# Patient Record
Sex: Male | Born: 1995 | Race: Black or African American | Hispanic: No | Marital: Single | Smoking: Current every day smoker
Health system: Southern US, Community
[De-identification: ages and names within clinical notes are randomized; demographics above are authoritative.]

## PROBLEM LIST (undated history)

## (undated) DIAGNOSIS — J45909 Unspecified asthma, uncomplicated: Secondary | ICD-10-CM

---
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
## 2016-07-01 ENCOUNTER — Emergency Department (HOSPITAL_COMMUNITY): Payer: Medicaid Other

## 2016-07-01 ENCOUNTER — Encounter (HOSPITAL_COMMUNITY): Payer: Self-pay | Admitting: Emergency Medicine

## 2016-07-01 ENCOUNTER — Emergency Department (HOSPITAL_COMMUNITY)
Admission: EM | Admit: 2016-07-01 | Discharge: 2016-07-01 | Disposition: A | Discharge status: Home / Self Care | Payer: Medicaid Other | Attending: Emergency Medicine | Admitting: Emergency Medicine

## 2016-07-01 DIAGNOSIS — J45909 Unspecified asthma, uncomplicated: Secondary | ICD-10-CM | POA: Diagnosis not present

## 2016-07-01 DIAGNOSIS — Y929 Unspecified place or not applicable: Secondary | ICD-10-CM | POA: Diagnosis not present

## 2016-07-01 DIAGNOSIS — S6991XA Unspecified injury of right wrist, hand and finger(s), initial encounter: Secondary | ICD-10-CM | POA: Diagnosis present

## 2016-07-01 DIAGNOSIS — Y93E5 Activity, floor mopping and cleaning: Secondary | ICD-10-CM | POA: Insufficient documentation

## 2016-07-01 DIAGNOSIS — F17200 Nicotine dependence, unspecified, uncomplicated: Secondary | ICD-10-CM | POA: Insufficient documentation

## 2016-07-01 DIAGNOSIS — W458XXA Other foreign body or object entering through skin, initial encounter: Secondary | ICD-10-CM | POA: Insufficient documentation

## 2016-07-01 DIAGNOSIS — Y990 Civilian activity done for income or pay: Secondary | ICD-10-CM | POA: Diagnosis not present

## 2016-07-01 DIAGNOSIS — S60551A Superficial foreign body of right hand, initial encounter: Secondary | ICD-10-CM

## 2016-07-01 DIAGNOSIS — S60351A Superficial foreign body of right thumb, initial encounter: Secondary | ICD-10-CM | POA: Insufficient documentation

## 2016-07-01 HISTORY — DX: Unspecified asthma, uncomplicated: J45.909

## 2016-07-01 MED ORDER — HYDROCODONE-ACETAMINOPHEN 5-325 MG PO TABS
1.0000 | ORAL_TABLET | Freq: Once | ORAL | Status: AC
  Administered 2016-07-01: 1 via ORAL
  Filled 2016-07-01: qty 1

## 2016-07-01 MED ORDER — CEPHALEXIN 500 MG PO CAPS: 500.0000 mg | ORAL_CAPSULE | Freq: Four times a day (QID) | ORAL | Status: DC

## 2016-07-01 MED ORDER — CEPHALEXIN 250 MG PO CAPS
500.0000 mg | ORAL_CAPSULE | Freq: Once | ORAL | Status: AC
  Administered 2016-07-01: 500 mg via ORAL
  Filled 2016-07-01: qty 2

## 2016-07-01 MED ORDER — TETANUS-DIPHTH-ACELL PERTUSSIS 5-2.5-18.5 LF-MCG/0.5 IM SUSP
0.5000 mL | Freq: Once | INTRAMUSCULAR | Status: AC
  Administered 2016-07-01: 0.5 mL via INTRAMUSCULAR
  Filled 2016-07-01: qty 0.5

## 2016-07-01 NOTE — ED Notes (Signed)
Patient endorses that while mopping had piece of the fiberglass handle go through his right hand.  No active bleeding noted at this time. Patient does not remember when he last had a tetanus vaccination. Movement and pulses all WNL

## 2016-07-01 NOTE — ED Notes (Signed)
PA in room with this RN to remove fiberglass from patient thumb.  Patient tolerated well

## 2016-07-01 NOTE — ED Notes (Signed)
Pt's hand placed in basin with sterile saline and iodine

## 2016-07-01 NOTE — ED Provider Notes (Signed)
CSN: [id number]     Arrival date & time 07/01/16  1927 History  By signing my name below, I, Emmanuella Mensah, attest that this documentation has been prepared under the direction and in the presence of [employer], PA-C. Electronically Signed: Angelene Giovanni, ED Scribe. 07/01/2016. 9:03 PM.   CC: Foreign body right hand   The history is provided by the patient. No language interpreter was used.   HPI Comments: Dylan Tapia is a 20 y.o. male who presents to the Emergency Department complaining of a bleeding puncture wound to the base of his right thumb s/p hand injury that occurred at work PTA. He reports associated pain with ROM of the thumb. Pt explains that he was mopping with a mop with a broken handle when a broken piece of the fiberglass from the handle went through his right thumb. Pt presents with the fiber stuck through the base of his right thumb. No alleviating factors noted. Pt has not tried any medications PTA. He is unsure of his last tetanus vaccine. He reports NKDA. No fever, chills, or n/v.    Past Medical History  Diagnosis Date  . Asthma    History reviewed. No pertinent past surgical history. No family history on file. Social History  Substance Use Topics  . Smoking status: Current Every Day Smoker  . Smokeless tobacco: None  . Alcohol Use: No    Review of Systems  A complete 10 system review of systems was obtained and all systems are negative except as noted in the HPI and PMH.    Allergies  Review of patient's allergies indicates no known allergies.  Home Medications   Prior to Admission medications   Not on File   BP 143/80 mmHg  Pulse 117  Temp(Src) 98.4 F (36.9 C) (Oral)  Resp 18  SpO2 100% Physical Exam  Constitutional: He is oriented to person, place, and time. He appears well-developed and well-nourished.  HENT:  Head: Normocephalic and atraumatic.  Cardiovascular: Normal rate and regular rhythm.   Pulmonary/Chest: Effort normal.   Musculoskeletal:  1-2 mm shard of blue plastic exiting through the thenar eminence on the volar side, entering through the dorsal side. No active bleeding, range of motion is slightly decreased secondary to pain, distally neurovascularly intact.  Neurological: He is alert and oriented to person, place, and time.  Skin: Skin is warm and dry.  Psychiatric: He has a normal mood and affect.  Nursing note and vitals reviewed.   ED Course  .Foreign Body Removal Date/Time: 07/01/2016 10:23 PM Performed by: Wynetta Emery Authorized by: Wynetta Emery Consent: Verbal consent obtained. Patient identity confirmed: verbally with patient Body area: skin General location: upper extremity Location details: right hand Patient sedated: no Patient restrained: no Localization method: serial x-rays Dressing: antibiotic ointment Tendon involvement: none Depth: deep Complexity: complex 1 objects recovered. Objects recovered: blue plastic shard Post-procedure assessment: foreign body removed Patient tolerance: Patient tolerated the procedure well with no immediate complications   (including critical care time) DIAGNOSTIC STUDIES: Oxygen Saturation is 100% on RA, normal by my interpretation.    COORDINATION OF CARE: 9:01 PM- Pt advised of plan for treatment and pt agrees. Pt informed of his x-ray results. He received a foreign object removal. He will receive a Tdap and Vicodin. He will also receive a second x-ray for verification.   Imaging Review Dg Finger Thumb Right  07/01/2016  CLINICAL DATA:  Injury to the right thumb as a piece of a plastic cleaning device (squeegee) is  stuck at the base of the thumb. Initial encounter. EXAM: RIGHT THUMB 2+V COMPARISON:  Right hand x-rays 08/28/2009. FINDINGS: Linear faint opaque foreign body indicating a piece plastic traverses the soft tissues overlying the head of the 1st metacarpal. No evidence of acute fracture or dislocation. No intrinsic osseous  abnormalities. IMPRESSION: Linear faint opaque foreign body indicating a piece of plastic in the soft tissues overlying the head of the 1st metacarpal. No osseous abnormality. Electronically Signed   By: Hulan Saashomas  Lawrence M.D.   On: 07/01/2016 20:34     Joni ReiningNicole Yardley Beltran, PA-C has personally reviewed and evaluated these images as part of her medical decision-making.  MDM   Final diagnoses:  Foreign body in hand, right, initial encounter    Filed Vitals:   07/01/16 2002 07/01/16 2207  BP: 143/80 103/82  Pulse: 117 59  Temp: 98.4 F (36.9 C) 97.8 F (36.6 C)  TempSrc: Oral Oral  Resp: 18 16  SpO2: 100% 99%    Medications  Tdap (BOOSTRIX) injection 0.5 mL (0.5 mLs Intramuscular Given 07/01/16 2104)  HYDROcodone-acetaminophen (NORCO/VICODIN) 5-325 MG per tablet 1 tablet (1 tablet Oral Given 07/01/16 2104)  cephALEXin (KEFLEX) capsule 500 mg (500 mg Oral Given 07/01/16 2112)    Dylan Tapia is 20 y.o. male presenting with A plastic foreign body to dominant hand. Patient was using a squeegee handle of which was frayed and broken, it apparently broke worse while the patient was using it and chart is a plastic one into his hand. Tetanus is updated, Pt given the option of local anesthesia versus attempt of foreign body removal without anesthesia and he walks without. Foreign body is removed without issue, repeat x-ray with no residual retained fragments. It is still narrowed that it is not really feasible to irrigate the wound tract. Will start the patient on antibiotics and we've had an extensive discussion of wound care and return precautions for infection.  Evaluation does not show pathology that would require ongoing emergent intervention or inpatient treatment. Pt is hemodynamically stable and mentating appropriately. Discussed findings and plan with patient/guardian, who agrees with care plan. All questions answered. Return precautions discussed and outpatient follow up given.   New  Prescriptions   CEPHALEXIN (KEFLEX) 500 MG CAPSULE    Take 1 capsule (500 mg total) by mouth 4 (four) times daily.    I personally performed the services described in this documentation, which was scribed in my presence. The recorded information has been reviewed and is accurate.   Wynetta Emeryicole Jeorge Reister, PA-C 07/01/16 2226  Geoffery Lyonsouglas Delo, MD 07/01/16 2239

## 2016-07-01 NOTE — Discharge Instructions (Signed)
Wash the affected area with soap and water and apply a thin layer of topical antibiotic ointment. Do this every 12 hours.   Do not use rubbing alcohol or hydrogen peroxide.                        Look for signs of infection: if you see redness, if the area becomes warm, if pain increases sharply, there is discharge (pus), if red streaks appear or you develop fever or vomiting, RETURN immediately to the Emergency Department  for a recheck.    Sliver Removal, Care After A sliver--also called a splinter--is a small and thin broken piece of an object that gets stuck (embedded) under the skin. A sliver can create a deep wound that can easily become infected. It is important to care for the wound after a sliver is removed to help prevent infection and other problems from developing. WHAT TO EXPECT AFTER THE PROCEDURE Slivers often break into smaller pieces when they are removed. If pieces of your sliver broke off and stayed in your skin, you will eventually see them working themselves out and you may feel some pain at the wound site. This is normal. HOME CARE INSTRUCTIONS  Keep all follow-up visits as directed by your health care provider. This is important.  There are many different ways to close and cover a wound, including stitches (sutures) and adhesive strips. Follow your health care provider's instructions about:  Wound care.  Bandage (dressing) changes and removal.  Wound closure removal.  Check the wound site every day for signs of infection. Watch for:  Red streaks coming from the wound.  Fever.  Redness or tenderness around the wound.  Fluid, blood, or pus coming from the wound.  A bad smell coming from the wound. SEEK MEDICAL CARE IF:  You think that a piece of the sliver is still in your skin.  Your wound was closed, as with sutures, and the edges of the wound break open.  You have signs of infection, including:  New or worsening redness around the wound.  New or  worsening tenderness around the wound.  Fluid, blood, or pus coming from the wound.  A bad smell coming from the wound or dressing. SEEK IMMEDIATE MEDICAL CARE IF: You have any of the following signs of infection:  Red streaks coming from the wound.  An unexplained fever.   This information is not intended to replace advice given to you by your health care provider. Make sure you discuss any questions you have with your health care provider.   Document Released: 12/03/2000 Document Revised: 12/27/2014 Document Reviewed: 08/08/2014 Elsevier Interactive Patient Education [employer]2016 [employer] Inc.

## 2016-07-01 NOTE — ED Notes (Signed)
Sterile guaze applied to wound site on right hand and wrapped with gauze.

## 2016-07-01 NOTE — ED Notes (Signed)
Pt verbalized understanding of d/c instructions and has no further questions. Pt stable and NAD. Pt d/c home with girlfriend driving

## 2016-11-03 ENCOUNTER — Ambulatory Visit (HOSPITAL_COMMUNITY)
Admission: EM | Admit: 2016-11-03 | Discharge: 2016-11-03 | Disposition: A | Discharge status: Home / Self Care | Payer: Medicaid Other | Attending: Family Medicine | Admitting: Family Medicine

## 2016-11-03 ENCOUNTER — Encounter (HOSPITAL_COMMUNITY): Payer: Self-pay | Admitting: Emergency Medicine

## 2016-11-03 DIAGNOSIS — N410 Acute prostatitis: Secondary | ICD-10-CM | POA: Diagnosis not present

## 2016-11-03 LAB — POCT URINALYSIS DIP (DEVICE)
Bilirubin Urine: NEGATIVE
Glucose, UA: NEGATIVE mg/dL
Ketones, ur: NEGATIVE mg/dL
LEUKOCYTES UA: NEGATIVE
NITRITE: NEGATIVE
PROTEIN: NEGATIVE mg/dL
SPECIFIC GRAVITY, URINE: 1.025 (ref 1.005–1.030)
UROBILINOGEN UA: 0.2 mg/dL (ref 0.0–1.0)
pH: 7 (ref 5.0–8.0)

## 2016-11-03 MED ORDER — DOXYCYCLINE HYCLATE 100 MG PO CAPS: 100.0000 mg | ORAL_CAPSULE | Freq: Two times a day (BID) | ORAL | 0 refills | Status: DC

## 2016-11-03 NOTE — Discharge Instructions (Signed)
Use advil for soreness, take all of antibiotic, reduce caffeine, drink plenty of water

## 2016-11-03 NOTE — ED Provider Notes (Signed)
[facility]    CSN: [id number] Arrival date & time: 11/03/16  1104     History   Chief Complaint Chief Complaint  Patient presents with  . Dysuria    HPI Dylan Tapia is a 20 y.o. male.   The history is provided by the patient.  Dysuria  This is a new problem. The current episode started more than 2 days ago (pain with ejaculation, has been very sexually active recently and agrees that is likely related.). The problem has not changed since onset.Pertinent negatives include no chest pain and no abdominal pain.    Past Medical History:  Diagnosis Date  . Asthma     There are no active problems to display for this patient.   History reviewed. No pertinent surgical history.     Home Medications    Prior to Admission medications   Medication Sig Start Date End Date Taking? Authorizing Provider  cephALEXin (KEFLEX) 500 MG capsule Take 1 capsule (500 mg total) by mouth 4 (four) times daily. 07/01/16   Joni ReiningNicole Pisciotta, PA-C    Family History History reviewed. No pertinent family history.  Social History Social History  Substance Use Topics  . Smoking status: Current Every Day Smoker  . Smokeless tobacco: Never Used  . Alcohol use No     Allergies   Patient has no known allergies.   Review of Systems Review of Systems  Constitutional: Negative.   Cardiovascular: Negative for chest pain.  Gastrointestinal: Negative.  Negative for abdominal pain.  Genitourinary: Positive for dysuria. Negative for discharge, flank pain, frequency, genital sores, penile pain, penile swelling, scrotal swelling and testicular pain.  All other systems reviewed and are negative.    Physical Exam Triage Vital Signs ED Triage Vitals  Enc Vitals Group     BP 11/03/16 1127 119/69     Pulse Rate 11/03/16 1127 63     Resp 11/03/16 1127 18     Temp 11/03/16 1127 98.1 F (36.7 C)     Temp Source 11/03/16 1127 Oral     SpO2 11/03/16 1127 99 %     Weight --    Height --      Head Circumference --      Peak Flow --      Pain Score 11/03/16 1131 0     Pain Loc --      Pain Edu? --      Excl. in GC? --    No data found.   Updated Vital Signs BP 119/69 (BP Location: Left Arm)   Pulse 63   Temp 98.1 F (36.7 C) (Oral)   Resp 18   SpO2 99%   Visual Acuity Right Eye Distance:   Left Eye Distance:   Bilateral Distance:    Right Eye Near:   Left Eye Near:    Bilateral Near:     Physical Exam  Constitutional: He is oriented to person, place, and time. He appears well-developed and well-nourished. No distress.  Laughing during hx.  Abdominal: Soft. Bowel sounds are normal. He exhibits no mass. There is no tenderness. There is no rebound and no guarding.  Neurological: He is alert and oriented to person, place, and time.  Skin: Skin is warm and dry.  Nursing note and vitals reviewed.    UC Treatments / Results  Labs (all labs ordered are listed, but only abnormal results are displayed) Labs Reviewed  POCT URINALYSIS DIP (DEVICE) - Abnormal; Notable for the following:  Result Value   Hgb urine dipstick TRACE (*)    All other components within normal limits    EKG  EKG Interpretation None       Radiology No results found.  Procedures Procedures (including critical care time)  Medications Ordered in UC Medications - No data to display   Initial Impression / Assessment and Plan / UC Course  I have reviewed the triage vital signs and the nursing notes.  Pertinent labs & imaging results that were available during my care of the patient were reviewed by me and considered in my medical decision making (see chart for details).  Clinical Course       Final Clinical Impressions(s) / UC Diagnoses   Final diagnoses:  None    New Prescriptions New Prescriptions   No medications on file     Linna HoffJames D Adelfo Diebel, MD 11/03/16 1229

## 2016-11-03 NOTE — ED Triage Notes (Signed)
[facility] a [facility] dysuria when he urinates and pain when he ejaculates x 4 days.The patient denied any penile discharge.

## 2017-01-20 ENCOUNTER — Emergency Department (HOSPITAL_COMMUNITY)
Admission: EM | Admit: 2017-01-20 | Discharge: 2017-01-20 | Disposition: A | Discharge status: Home / Self Care | Payer: Medicaid Other | Attending: Emergency Medicine | Admitting: Emergency Medicine

## 2017-01-20 ENCOUNTER — Emergency Department (HOSPITAL_COMMUNITY): Payer: Medicaid Other

## 2017-01-20 ENCOUNTER — Encounter (HOSPITAL_COMMUNITY): Payer: Self-pay | Admitting: Emergency Medicine

## 2017-01-20 ENCOUNTER — Encounter (HOSPITAL_COMMUNITY): Payer: Self-pay

## 2017-01-20 ENCOUNTER — Emergency Department (HOSPITAL_COMMUNITY)
Admission: EM | Admit: 2017-01-20 | Discharge: 2017-01-20 | Disposition: A | Discharge status: Home / Self Care | Payer: Medicaid Other | Source: Home / Self Care | Attending: Emergency Medicine | Admitting: Emergency Medicine

## 2017-01-20 DIAGNOSIS — J45909 Unspecified asthma, uncomplicated: Secondary | ICD-10-CM | POA: Diagnosis not present

## 2017-01-20 DIAGNOSIS — Y999 Unspecified external cause status: Secondary | ICD-10-CM | POA: Insufficient documentation

## 2017-01-20 DIAGNOSIS — W228XXA Striking against or struck by other objects, initial encounter: Secondary | ICD-10-CM | POA: Insufficient documentation

## 2017-01-20 DIAGNOSIS — Z79899 Other long term (current) drug therapy: Secondary | ICD-10-CM | POA: Insufficient documentation

## 2017-01-20 DIAGNOSIS — S6991XA Unspecified injury of right wrist, hand and finger(s), initial encounter: Secondary | ICD-10-CM | POA: Diagnosis present

## 2017-01-20 DIAGNOSIS — Y929 Unspecified place or not applicable: Secondary | ICD-10-CM | POA: Insufficient documentation

## 2017-01-20 DIAGNOSIS — Z87891 Personal history of nicotine dependence: Secondary | ICD-10-CM | POA: Insufficient documentation

## 2017-01-20 DIAGNOSIS — S63255A Unspecified dislocation of left ring finger, initial encounter: Secondary | ICD-10-CM | POA: Insufficient documentation

## 2017-01-20 DIAGNOSIS — S63254A Unspecified dislocation of right ring finger, initial encounter: Secondary | ICD-10-CM | POA: Diagnosis not present

## 2017-01-20 DIAGNOSIS — S63269A Dislocation of metacarpophalangeal joint of unspecified finger, initial encounter: Secondary | ICD-10-CM

## 2017-01-20 DIAGNOSIS — S63266A Dislocation of metacarpophalangeal joint of right little finger, initial encounter: Secondary | ICD-10-CM

## 2017-01-20 DIAGNOSIS — IMO0001 Reserved for inherently not codable concepts without codable children: Secondary | ICD-10-CM

## 2017-01-20 DIAGNOSIS — Y939 Activity, unspecified: Secondary | ICD-10-CM | POA: Diagnosis not present

## 2017-01-20 DIAGNOSIS — S63264A Dislocation of metacarpophalangeal joint of right ring finger, initial encounter: Secondary | ICD-10-CM

## 2017-01-20 MED ORDER — OXYCODONE-ACETAMINOPHEN 5-325 MG PO TABS: 1.0000 | ORAL_TABLET | Freq: Four times a day (QID) | ORAL | 0 refills | Status: DC | PRN

## 2017-01-20 MED ORDER — PROPOFOL 10 MG/ML IV BOLUS
0.5000 mg/kg | Freq: Once | INTRAVENOUS | Status: AC
Start: 2017-01-20 — End: 2017-01-20
  Administered 2017-01-20: 42.3 mg via INTRAVENOUS
  Filled 2017-01-20: qty 20

## 2017-01-20 MED ORDER — OXYCODONE-ACETAMINOPHEN 5-325 MG PO TABS
1.0000 | ORAL_TABLET | Freq: Once | ORAL | Status: AC
  Administered 2017-01-20: 1 via ORAL
  Filled 2017-01-20: qty 1

## 2017-01-20 MED ORDER — PROPOFOL 10 MG/ML IV BOLUS
INTRAVENOUS | Status: AC | PRN
  Administered 2017-01-20: 20 mg via INTRAVENOUS

## 2017-01-20 MED ORDER — FENTANYL CITRATE (PF) 100 MCG/2ML IJ SOLN
INTRAMUSCULAR | Status: AC
  Filled 2017-01-20: qty 2

## 2017-01-20 MED ORDER — PROPOFOL 10 MG/ML IV BOLUS
INTRAVENOUS | Status: AC | PRN
  Administered 2017-01-20: 40 mg via INTRAVENOUS

## 2017-01-20 MED ORDER — HYDROMORPHONE HCL 2 MG/ML IJ SOLN
1.0000 mg | Freq: Once | INTRAMUSCULAR | Status: DC
Start: 2017-01-20 — End: 2017-01-21

## 2017-01-20 NOTE — ED Notes (Signed)
Patient exited room cursing and walked to waiting room.  Visitor left behind him.

## 2017-01-20 NOTE — Progress Notes (Signed)
Orthopedic Tech Progress Note Patient Details:  Dylan Tapia 05/04/1996 [id number]  Ortho Devices Type of Ortho Device: Ace wrap, Ulna gutter splint Ortho Device/Splint Location: RUE Ortho Device/Splint Interventions: Ordered, Application   Jennye MoccasinHughes, [name] 01/20/2017, 9:14 PM

## 2017-01-20 NOTE — Discharge Instructions (Signed)
Please follow up with hand specialist next week for further management of your hand injury.

## 2017-01-20 NOTE — ED Triage Notes (Signed)
Patient here with right hand pain and swelling after punching door today. Positive distal pulses and full ROM. Ice applied on arrival

## 2017-01-20 NOTE — ED Provider Notes (Signed)
[facility] Provider Note    By signing my name below, I, Earmon Phoenix, attest that this documentation has been prepared under the direction and in the presence of Fayrene Helper, PA-C. Electronically Signed: Earmon Phoenix, ED Scribe. 01/20/17. 10:15 PM    History   Chief Complaint Chief Complaint  Patient presents with  . Hand Injury    The history is provided by the patient and medical records. No language interpreter was used.    Dylan Tapia is a 21 y.o. male who presents to the Emergency Department complaining of an injury of the right hand secondary to punching a door approximately six hours ago. He reports associated severe sharp pain and swelling of the hand. He has not taken anything for pain relief. Touching the area or trying to move the fingers increases the pain. Pt denies alleviating factors. He denies numbness, tingling or weakness of the right hand, bruising, wounds or any other injuries. Pt is right hand dominant.    Past Medical History:  Diagnosis Date  . Asthma     There are no active problems to display for this patient.   History reviewed. No pertinent surgical history.     Home Medications    Prior to Admission medications   Medication Sig Start Date End Date Taking? Authorizing Provider  cephALEXin (KEFLEX) 500 MG capsule Take 1 capsule (500 mg total) by mouth 4 (four) times daily. 07/01/16   Nicole Pisciotta, PA-C  doxycycline (VIBRAMYCIN) 100 MG capsule Take 1 capsule (100 mg total) by mouth 2 (two) times daily. 11/03/16   Linna Hoff, MD    Family History No family history on file.  Social History Social History  Substance Use Topics  . Smoking status: Former Smoker    Types: Cigarettes    Quit date: 01/13/2017  . Smokeless tobacco: Never Used  . Alcohol use No     Allergies   Patient has no known allergies.   Review of Systems Review of Systems  Musculoskeletal: Positive for arthralgias and joint swelling.  Skin:  Negative for color change and wound.  Neurological: Positive for numbness. Negative for weakness.     Physical Exam Updated Vital Signs BP 131/78 (BP Location: Right Arm)   Pulse 114   Temp 98.6 F (37 C) (Oral)   Resp 14   Ht 5\' 10"  (1.778 m)   Wt 186 lb 3.2 oz (84.5 kg)   SpO2 100%   BMI 26.72 kg/m   Physical Exam  Constitutional: He is oriented to person, place, and time. He appears well-developed and well-nourished.  HENT:  Head: Normocephalic and atraumatic.  Neck: Normal range of motion.  Cardiovascular: Normal rate.   Brisk cap refill to all fingers of right hand. Right radial pulse 2+.  Pulmonary/Chest: Effort normal.  Musculoskeletal: He exhibits edema, tenderness and deformity.  Right hand with obvious closed deformity of proximal metacarpal region involving 3rd, 4th and 5th metacarpals. No skin tenting. Unable to make a fist secondary to pain. Able to flex and extend, supinate and pronate right wrist.  Neurological: He is alert and oriented to person, place, and time.  Skin: Skin is warm and dry.  Psychiatric: He has a normal mood and affect. His behavior is normal.  Nursing note and vitals reviewed.    ED Treatments / Results  DIAGNOSTIC STUDIES: Oxygen Saturation is 100% on RA, normal by my interpretation.   COORDINATION OF CARE: 8:20 PM- Will order pain medication and consult hand specialist. Pt verbalizes understanding and  agrees to plan.  8:33 PM- Spoke with Dr. Janee Morn who advised to attempt reduction here in the ED, apply ulnar gutter splint then get post reduction films.  9:01 PM- Discussed sedation vs narcotic pain relief for reduction procedure. Per pt request, will administer conscious sedation.   Medications  HYDROmorphone (DILAUDID) injection 1 mg (1 mg Intravenous Not Given 01/20/17 2145)  fentaNYL (SUBLIMAZE) 100 MCG/2ML injection (not administered)  oxyCODONE-acetaminophen (PERCOCET/ROXICET) 5-325 MG per tablet 1 tablet (1 tablet Oral Given  01/20/17 2028)  propofol (DIPRIVAN) 10 mg/mL bolus/IV push 42.3 mg (42.3 mg Intravenous Given 01/20/17 2145)  propofol (DIPRIVAN) 10 mg/mL bolus/IV push (40 mg Intravenous Given 01/20/17 2126)  propofol (DIPRIVAN) 10 mg/mL bolus/IV push (20 mg Intravenous Given 01/20/17 2127)  propofol (DIPRIVAN) 10 mg/mL bolus/IV push (20 mg Intravenous Given 01/20/17 2127)    Labs (all labs ordered are listed, but only abnormal results are displayed) Labs Reviewed - No data to display  EKG  EKG Interpretation None       Radiology Dg Hand 2 View Right  Result Date: 01/20/2017 CLINICAL DATA:  21 y/o  M; post reduction. EXAM: RIGHT HAND - 2 VIEW COMPARISON:  None. FINDINGS: On the lateral radiograph there appears to be reduction of fourth and fifth carpometacarpal dislocations. Fine bony details are obscured by overlying bandaging. No new acute fracture or dislocation identified. IMPRESSION: On the lateral radiograph there appears to be reduction of fourth and fifth carpometacarpal dislocations. Fine bony details are obscured by overlying bandaging. No new acute fracture or dislocation identified. Electronically Signed   By: Mitzi Hansen M.D.   On: 01/20/2017 22:03   Dg Hand Complete Right  Result Date: 01/20/2017 CLINICAL DATA:  21 y/o M; right hand pain after punching a door with swelling of the fourth and fifth metacarpals and metacarpophalangeal joints. EXAM: RIGHT HAND - COMPLETE 3+ VIEW COMPARISON:  07/01/2016 right hand radiograph. FINDINGS: Fourth and fifth digit carpometacarpal dislocation with posterior displacement of the metacarpals relative to the carpal bones. Bony densities at the site of dislocation probably represent fracture fragments. No other fracture is identified. IMPRESSION: Fourth and fifth digit carpometacarpal dislocation with posterior displacement of the metacarpals relative to the carpal bones. Bony densities at the site of dislocation probably represent fracture fragments. No  other fracture is identified. Electronically Signed   By: Mitzi Hansen M.D.   On: 01/20/2017 20:00    Procedures Reduction of dislocation Date/Time: 01/20/2017 9:18 PM Performed by: Fayrene Helper Authorized by: Fayrene Helper  Consent: Verbal consent obtained. Written consent obtained. Risks and benefits: risks, benefits and alternatives were discussed Consent given by: patient Patient understanding: patient states understanding of the procedure being performed Patient consent: the patient's understanding of the procedure matches consent given Procedure consent: procedure consent matches procedure scheduled Relevant documents: relevant documents present and verified Test results: test results available and properly labeled Site marked: the operative site was marked Imaging studies: imaging studies available Required items: required blood products, implants, devices, and special equipment available Patient identity confirmed: verbally with patient Time out: Immediately prior to procedure a "time out" was called to verify the correct patient, procedure, equipment, support staff and site/side marked as required. Preparation: Patient was prepped and draped in the usual sterile fashion. Local anesthesia used: no  Anesthesia: Local anesthesia used: no  Sedation: Patient sedated: yes Sedation type: moderate (conscious) sedation Sedatives: propofol Analgesia: fentanyl Sedation start date/time: 01/20/2017 9:25 PM Sedation end date/time: 01/20/2017 9:34 PM Vitals: Vital signs were monitored during sedation.  Patient tolerance: Patient tolerated the procedure well with no immediate complications  .Sedation Date/Time: 01/20/2017 9:06 PM Performed by: Fayrene HelperRAN, Verneda Hollopeter Authorized by: Fayrene HelperRAN, Brynden Thune   Consent:    Consent obtained:  Verbal and written   Consent given by:  Patient   Risks discussed:  Allergic reaction, prolonged hypoxia resulting in organ damage, prolonged sedation necessitating  reversal, inadequate sedation, respiratory compromise necessitating ventilatory assistance and intubation, vomiting, nausea and dysrhythmia   Alternatives discussed:  Analgesia without sedation Indications:    Procedure performed:  Dislocation reduction   Procedure necessitating sedation performed by:  Physician performing sedation   Intended level of sedation:  Moderate (conscious sedation) Pre-sedation assessment:    Time since last food or drink:  NPO all day   ASA classification: class 1 - normal, healthy patient     Neck mobility: normal     Mouth opening:  3 or more finger widths   Thyromental distance:  4 finger widths   Pre-sedation assessment completed:  01/20/2017 9:22 PM Immediate pre-procedure details:    Reassessment: Patient reassessed immediately prior to procedure   Procedure details (see MAR for exact dosages):    Sedation start time:  01/20/2017 9:25 PM   Preoxygenation:  Nasal cannula   Sedation:  Propofol   Analgesia:  None   Intra-procedure monitoring:  Cardiac monitor, blood pressure monitoring, frequent LOC assessments, frequent vital sign checks, continuous pulse oximetry and continuous capnometry   Intra-procedure events: none     Sedation end time:  01/20/2017 9:34 PM   Total sedation time (minutes):  9 Post-procedure details:    Post-sedation assessment completed:  01/20/2017 9:35 PM   Attendance: Constant attendance by certified staff until patient recovered     Recovery: Patient returned to pre-procedure baseline     Specimens recovered:  None   Patient is stable for discharge or admission: yes     Patient tolerance:  Tolerated well, no immediate complications   (including critical care time)  Medications Ordered in ED Medications  HYDROmorphone (DILAUDID) injection 1 mg (1 mg Intravenous Not Given 01/20/17 2145)  fentaNYL (SUBLIMAZE) 100 MCG/2ML injection (not administered)  oxyCODONE-acetaminophen (PERCOCET/ROXICET) 5-325 MG per tablet 1 tablet (1 tablet  Oral Given 01/20/17 2028)  propofol (DIPRIVAN) 10 mg/mL bolus/IV push 42.3 mg (42.3 mg Intravenous Given 01/20/17 2145)  propofol (DIPRIVAN) 10 mg/mL bolus/IV push (40 mg Intravenous Given 01/20/17 2126)  propofol (DIPRIVAN) 10 mg/mL bolus/IV push (20 mg Intravenous Given 01/20/17 2127)  propofol (DIPRIVAN) 10 mg/mL bolus/IV push (20 mg Intravenous Given 01/20/17 2127)     Initial Impression / Assessment and Plan / ED Course  I have reviewed the triage vital signs and the nursing notes.  Pertinent labs & imaging results that were available during my care of the patient were reviewed by me and considered in my medical decision making (see chart for details).     BP 126/74   Pulse 66   Temp 98.6 F (37 C) (Oral)   Resp 14   Ht 5\' 10"  (1.778 m)   Wt 84.5 kg   SpO2 100%   BMI 26.72 kg/m    I personally performed the services described in this documentation, which was scribed in my presence. The recorded information has been reviewed and is accurate.     Final Clinical Impressions(s) / ED Diagnoses   Final diagnoses:  Closed dislocation of fifth metacarpal bone of right hand  Closed dislocation of fourth metacarpal bone of right hand    New Prescriptions New  Prescriptions   OXYCODONE-ACETAMINOPHEN (PERCOCET/ROXICET) 5-325 MG TABLET    Take 1 tablet by mouth every 6 (six) hours as needed for severe pain.   10:37 PM Pt punched a wall and injured his R dominant hand.  He suffered a closed 4th and 5th metacarpal injury.  Successful reduction of dislocation under conscious sedation.  Pt will f/u with hand specialist Dr. Janee Morn for further management.     Fayrene Helper, PA-C 01/20/17 2240    Alvira Monday, MD 01/23/17 [phone]

## 2017-01-20 NOTE — ED Triage Notes (Signed)
Pt c/o hand pain that started around 2pm after he hit a door.  Hand is swollen, no other complaints at this time.

## 2017-09-25 ENCOUNTER — Emergency Department (HOSPITAL_COMMUNITY)
Admission: EM | Admit: 2017-09-25 | Discharge: 2017-09-25 | Disposition: A | Discharge status: Home / Self Care | Payer: Medicaid Other | Attending: Emergency Medicine | Admitting: Emergency Medicine

## 2017-09-25 ENCOUNTER — Encounter (HOSPITAL_COMMUNITY): Payer: Self-pay | Admitting: *Deleted

## 2017-09-25 ENCOUNTER — Emergency Department (HOSPITAL_COMMUNITY): Payer: Medicaid Other

## 2017-09-25 DIAGNOSIS — J45909 Unspecified asthma, uncomplicated: Secondary | ICD-10-CM | POA: Insufficient documentation

## 2017-09-25 DIAGNOSIS — Y998 Other external cause status: Secondary | ICD-10-CM | POA: Diagnosis not present

## 2017-09-25 DIAGNOSIS — Z87891 Personal history of nicotine dependence: Secondary | ICD-10-CM | POA: Insufficient documentation

## 2017-09-25 DIAGNOSIS — Y929 Unspecified place or not applicable: Secondary | ICD-10-CM | POA: Diagnosis not present

## 2017-09-25 DIAGNOSIS — Y9343 Activity, gymnastics: Secondary | ICD-10-CM | POA: Diagnosis not present

## 2017-09-25 DIAGNOSIS — S51812A Laceration without foreign body of left forearm, initial encounter: Secondary | ICD-10-CM | POA: Diagnosis present

## 2017-09-25 DIAGNOSIS — S41112A Laceration without foreign body of left upper arm, initial encounter: Secondary | ICD-10-CM

## 2017-09-25 DIAGNOSIS — W25XXXA Contact with sharp glass, initial encounter: Secondary | ICD-10-CM | POA: Insufficient documentation

## 2017-09-25 MED ORDER — TETANUS-DIPHTH-ACELL PERTUSSIS 5-2.5-18.5 LF-MCG/0.5 IM SUSP: 0.5000 mL | Freq: Once | INTRAMUSCULAR | Status: DC

## 2017-09-25 MED ORDER — CEPHALEXIN 500 MG PO CAPS
500.0000 mg | ORAL_CAPSULE | Freq: Four times a day (QID) | ORAL | 0 refills | Status: DC
Start: 2017-09-25 — End: 2018-05-19

## 2017-09-25 MED ORDER — LIDOCAINE-EPINEPHRINE (PF) 2 %-1:200000 IJ SOLN
10.0000 mL | Freq: Once | INTRAMUSCULAR | Status: AC
  Administered 2017-09-25: 10 mL
  Filled 2017-09-25: qty 20

## 2017-09-25 NOTE — ED Notes (Signed)
Patient refused wound irrigation until he is given lidocaine injection.

## 2017-09-25 NOTE — ED Notes (Signed)
Declined W/C at D/C and was escorted to lobby by RN. 

## 2017-09-25 NOTE — ED Triage Notes (Signed)
Pt reports doing a back flip and landed on glass door. Has multiple small lacerations and puncture wounds to left forearm. Bleeding controlled. Unsure about tetanus.

## 2017-09-25 NOTE — Discharge Instructions (Signed)
You may have diarrhea from the antibiotics.  It is very important that you continue to take the antibiotics even if you get diarrhea unless a medical professional tells you that you may stop taking them.  If you stop too early the bacteria you are being treated for will become stronger and you may need different, more powerful antibiotics that have more side effects and worsening diarrhea.  Please stay well hydrated and consider probiotics as they may decrease the severity of your diarrhea.   ° °Please take Ibuprofen (Advil, motrin) and Tylenol (acetaminophen) to relieve your pain.  You may take up to 600 MG (3 pills) of normal strength ibuprofen every 8 hours as needed.  In between doses of ibuprofen you make take tylenol, up to 1,000 mg (two extra strength pills).  Do not take more than 3,000 mg tylenol in a 24 hour period.  Please check all medication labels as many medications such as pain and cold medications may contain tylenol.  Do not drink alcohol while taking these medications.  Do not take other NSAID'S while taking ibuprofen (such as aleve or naproxen).  Please take ibuprofen with food to decrease stomach upset. ° ° °

## 2017-09-25 NOTE — ED Provider Notes (Signed)
[facility]-EMERGENCY DEPT Provider Note   CSN: [id number] Arrival date & time: 09/25/17  1558     History   Chief Complaint Chief Complaint  Patient presents with  . Laceration    HPI Dylan Tapia is a 21 y.o. male who presents for reported evaluation of lacerations to his left forearm that occurred shortly prior to arrival.  He reports that he was doing a back flip and landed on a glass door.  Chart review shows his last tetanus shot was in 2017.  He denies any numbness or tingling to his left hand.  He says he is able to move all his fingers.   HPI  Past Medical History:  Diagnosis Date  . Asthma     There are no active problems to display for this patient.   History reviewed. No pertinent surgical history.     Home Medications    Prior to Admission medications   Medication Sig Start Date End Date Taking? Authorizing Provider  ibuprofen (ADVIL,MOTRIN) 800 MG tablet Take 800 mg by mouth every 8 (eight) hours as needed for headache.   Yes [provider]  cephALEXin (KEFLEX) 500 MG capsule Take 1 capsule (500 mg total) by mouth 4 (four) times daily. 09/25/17   Cristina Gong, PA-C    Family History History reviewed. No pertinent family history.  Social History Social History  Substance Use Topics  . Smoking status: Former Smoker    Types: Cigarettes    Quit date: 01/13/2017  . Smokeless tobacco: Never Used  . Alcohol use No     Allergies   Patient has no known allergies.   Review of Systems Review of Systems  Constitutional: Negative for fatigue and fever.  Musculoskeletal: Negative for arthralgias.  Skin: Positive for wound. Negative for color change and pallor.  Neurological: Negative for weakness and numbness.     Physical Exam Updated Vital Signs BP 129/83 (BP Location: Right Arm)   Pulse (!) 110   Temp 98.1 F (36.7 C) (Oral)   Resp 18   SpO2 100%   Physical Exam  Constitutional: He appears well-developed and well-nourished.    HENT:  Head: Normocephalic and atraumatic.  Cardiovascular:  Left hand is warm and well perfused with good color.   Musculoskeletal:  Patient has full active ROM to all joints of the fingers on his left hand.    Neurological: He is alert.  Sensation intact to left upper extremity  Skin: He is not diaphoretic.  Left forearm: Multiple small lacerations (sub cm) to left forearm.  There are three larger lacerations.  The largest is approx 2cm over the volar aspect of his left forearm, which is through the dermis with subcutaneous tissue showing.  There is one V shaped laceration radial to the large laceration that is approx 1.5 cm.   There is a 1cm laceration over ventral forearm with subcutaneous tissue showing.   There are two 1cm lacerations more proximally that are very close and parallel to each other.  Subcutaneous tissue is present.  There is a oozing 1cm laceration present to the radial midforearm.  There are multiple other small lacerations that are sub centimeter, not through dermis, not bleeding and no repair needed.   Psychiatric: He has a normal mood and affect. His behavior is normal.  Nursing note and vitals reviewed.    ED Treatments / Results  Labs (all labs ordered are listed, but only abnormal results are displayed) Labs Reviewed - No data to display  EKG  EKG Interpretation None       Radiology Dg Forearm Left  Result Date: 09/25/2017 CLINICAL DATA:  Left forearm lacerations after landing on glass. EXAM: LEFT FOREARM - 2 VIEW COMPARISON:  None. FINDINGS: There is no evidence of fracture or other focal bone lesions. No radiopaque foreign body. Small laceration noted along the radial aspect of the volar distal forearm. IMPRESSION: Small laceration along the radial aspect of the volar distal forearm. No radiopaque foreign body. No acute osseous abnormality. Electronically Signed   By: Obie Dredge M.D.   On: 09/25/2017 16:28    Procedures .Marland KitchenLaceration  Repair Date/Time: 09/25/2017 8:56 PM Performed by: Cristina Gong Authorized by: Cristina Gong   Consent:    Consent obtained:  Verbal   Consent given by:  Patient   Risks discussed:  Infection, pain, need for additional repair, poor cosmetic result, retained foreign body, vascular damage, tendon damage and nerve damage (Retained glass)   Alternatives discussed:  No treatment (steri-strips, staples, skin glue) Anesthesia (see MAR for exact dosages):    Anesthesia method:  Local infiltration   Local anesthetic:  Lidocaine 2% WITH epi Laceration details:    Location:  Shoulder/arm   Shoulder/arm location:  L lower arm   Wound length (cm): 2. Repair type:    Repair type:  Simple Pre-procedure details:    Preparation:  Patient was prepped and draped in usual sterile fashion and imaging obtained to evaluate for foreign bodies Exploration:    Hemostasis achieved with:  Epinephrine and direct pressure   Wound exploration: wound explored through full range of motion and entire depth of wound probed and visualized     Wound extent: areolar tissue violated     Wound extent: no fascia violation noted, no muscle damage noted, no nerve damage noted, no tendon damage noted and no vascular damage noted   Treatment:    Area cleansed with:  Saline and soap and water (Chlorhexidine)   Irrigation solution:  Sterile saline   Irrigation method:  Syringe and tap Skin repair:    Repair method:  Sutures   Suture size:  4-0   Suture material:  Prolene   Suture technique: Simple interrupted, horizontal mattress.   Number of sutures:  3 Approximation:    Approximation:  Close Post-procedure details:    Dressing:  Antibiotic ointment, non-adherent dressing, sterile dressing and bulky dressing   Patient tolerance of procedure:  Tolerated well, no immediate complications    .Marland KitchenLaceration Repair #2 Date/Time: 09/25/2017 8:56 PM Performed by: Cristina Gong Authorized by: Cristina Gong   Consent:    Consent obtained:  Verbal   Consent given by:  Patient   Risks discussed:  Infection, pain, need for additional repair, poor cosmetic result, retained foreign body, vascular damage, tendon damage and nerve damage (Retained glass)   Alternatives discussed:  No treatment (steri-strips, staples, skin glue) Anesthesia (see MAR for exact dosages):    Anesthesia method:  Local infiltration   Local anesthetic:  Lidocaine 2% WITH epi Laceration details:    Location:  Shoulder/arm   Shoulder/arm location:  L lower arm   Wound length (cm): 1.V shaped Repair type:    Repair type:  Simple Pre-procedure details:    Preparation:  Patient was prepped and draped in usual sterile fashion and imaging obtained to evaluate for foreign bodies Exploration:    Hemostasis achieved with:  Epinephrine and direct pressure   Wound exploration: wound explored through full range of motion and entire depth  of wound probed and visualized     Wound extent: areolar tissue violated     Wound extent: no fascia violation noted, no muscle damage noted, no nerve damage noted, no tendon damage noted and no vascular damage noted   Treatment:    Area cleansed with:  Saline and soap and water (Chlorhexidine)   Irrigation solution:  Sterile saline   Irrigation method:  Syringe and tap Skin repair:    Repair method:  Sutures   Suture size:  5-0   Suture material:  Prolene   Suture technique:  horizontal mattress.   Number of sutures:  1 Approximation:    Approximation:  Close Post-procedure details:    Dressing:  Antibiotic ointment, non-adherent dressing, sterile dressing and bulky dressing   Patient tolerance of procedure:  Tolerated well, no immediate complications  .Marland KitchenLaceration Repair #3 (1cm) Date/Time: 09/25/2017 8:56 PM Performed by: Cristina Gong Authorized by: Cristina Gong   Consent:    Consent obtained:  Verbal   Consent given by:  Patient   Risks discussed:   Infection, pain, need for additional repair, poor cosmetic result, retained foreign body, vascular damage, tendon damage and nerve damage (Retained glass)   Alternatives discussed:  No treatment (steri-strips, staples, skin glue) Anesthesia (see MAR for exact dosages):    Anesthesia method:  Local infiltration   Local anesthetic:  Lidocaine 2% WITH epi Laceration details:    Location:  Shoulder/arm   Shoulder/arm location:  L lower arm   Wound length (cm): 1. Repair type:    Repair type:  Simple Pre-procedure details:    Preparation:  Patient was prepped and draped in usual sterile fashion and imaging obtained to evaluate for foreign bodies Exploration:    Hemostasis achieved with:  Epinephrine and direct pressure   Wound exploration: wound explored through full range of motion and entire depth of wound probed and visualized     Wound extent: areolar tissue violated     Wound extent: no fascia violation noted, no muscle damage noted, no nerve damage noted, no tendon damage noted and no vascular damage noted   Treatment:    Area cleansed with:  Saline and soap and water (Chlorhexidine)   Irrigation solution:  Sterile saline   Irrigation method:  Syringe and tap Skin repair:    Repair method:  Sutures   Suture size:  5-0   Suture material:  Prolene   Suture technique: horizontal mattress.   Number of sutures:  1 Approximation:    Approximation:  Close Post-procedure details:    Dressing:  Antibiotic ointment, non-adherent dressing, sterile dressing and bulky dressing   Patient tolerance of procedure:  Tolerated well, no immediate complications  .Marland KitchenLaceration Repair #4- two parallel 1cm lacerations Date/Time: 09/25/2017 8:56 PM Performed by: Cristina Gong Authorized by: Cristina Gong   Consent:    Consent obtained:  Verbal   Consent given by:  Patient   Risks discussed:  Infection, pain, need for additional repair, poor cosmetic result, retained foreign body,  vascular damage, tendon damage and nerve damage (Retained glass)   Alternatives discussed:  No treatment (steri-strips, staples, skin glue) Anesthesia (see MAR for exact dosages):    Anesthesia method:  Local infiltration   Local anesthetic:  Lidocaine 2% WITH epi Laceration details:    Location:  Shoulder/arm   Shoulder/arm location:  L lower arm   Wound length (cm): 1. Repair type:    Repair type:  Simple Pre-procedure details:    Preparation:  Patient  was prepped and draped in usual sterile fashion and imaging obtained to evaluate for foreign bodies Exploration:    Hemostasis achieved with:  Epinephrine and direct pressure   Wound exploration: wound explored through full range of motion and entire depth of wound probed and visualized     Wound extent: areolar tissue violated     Wound extent: no fascia violation noted, no muscle damage noted, no nerve damage noted, no tendon damage noted and no vascular damage noted   Treatment:    Area cleansed with:  Saline and soap and water (Chlorhexidine)   Irrigation solution:  Sterile saline   Irrigation method:  Syringe and tap Skin repair:    Repair method:  Sutures   Suture size:  5-0   Suture material:  Prolene   Suture technique: Simple interrupted   Number of sutures: 1 Approximation:    Approximation:  Close Post-procedure details:    Dressing:  Antibiotic ointment, non-adherent dressing, sterile dressing and bulky dressing   Patient tolerance of procedure:  Tolerated well, no immediate complications  .Marland KitchenLaceration Repair #5 Date/Time: 09/25/2017 8:56 PM Performed by: Cristina Gong Authorized by: Cristina Gong   Consent:    Consent obtained:  Verbal   Consent given by:  Patient   Risks discussed:  Infection, pain, need for additional repair, poor cosmetic result, retained foreign body, vascular damage, tendon damage and nerve damage (Retained glass)   Alternatives discussed:  No treatment (steri-strips, staples,  skin glue) Anesthesia (see MAR for exact dosages):    Anesthesia method:  Local infiltration   Local anesthetic:  Lidocaine 2% WITH epi Laceration details:    Location:  Shoulder/arm   Shoulder/arm location:  L lower arm   Wound length (cm): 1. Repair type:    Repair type:  Simple Pre-procedure details:    Preparation:  Patient was prepped and draped in usual sterile fashion and imaging obtained to evaluate for foreign bodies Exploration:    Hemostasis achieved with:  Epinephrine and direct pressure, sutures   Wound exploration: wound explored through full range of motion and entire depth of wound probed and visualized     Wound extent: areolar tissue violated     Wound extent: no fascia violation noted, no muscle damage noted, no nerve damage noted, no tendon damage noted and no vascular damage noted   Treatment:    Area cleansed with:  Saline and soap and water (Chlorhexidine)   Irrigation solution:  Sterile saline   Irrigation method:  Syringe and tap Skin repair:    Repair method:  Sutures   Suture size:  5-0   Suture material:  Prolene   Suture technique: Simple interrupted, horizontal mattress.   Number of sutures:  1 Approximation:    Approximation:  Close Post-procedure details:    Dressing:  Antibiotic ointment, non-adherent dressing, sterile dressing and bulky dressing   Patient tolerance of procedure:  Tolerated well, no immediate complications   Before and after the procedure the motor function,strength, sensation, and circulatory function was assessed.  After the procedure patient had intact motor function with 5/5 strength (equal to unaffected side) to all joints distal to the incision site with the only sensory changes attributable to the local anesthetic.     Medications Ordered in ED Medications  lidocaine-EPINEPHrine (XYLOCAINE W/EPI) 2 %-1:200000 (PF) injection 10 mL (10 mLs Infiltration Given 09/25/17 1717)     Initial Impression / Assessment and Plan /  ED Course  I have reviewed the triage vital signs  and the nursing notes.  Pertinent labs & imaging results that were available during my care of the patient were reviewed by me and considered in my medical decision making (see chart for details).    Pressure irrigation performed. Wound explored and base of wound visualized in a bloodless field without evidence of foreign body.  Laceration occurred < 8 hours prior to repair which was well tolerated.  Tdap up to date.  Pt has  no comorbidities to effect normal wound healing. Pt discharged  With antibiotics.  Discussed suture home care with patient and answered questions. Pt to follow-up for wound check and suture removal in 7-10 days; they are to return to the ED sooner for signs of infection. Pt is hemodynamically stable with no complaints prior to dc.     Final Clinical Impressions(s) / ED Diagnoses   Final diagnoses:  Laceration of left upper extremity, initial encounter    New Prescriptions Discharge Medication List as of 09/25/2017  6:22 PM    START taking these medications   Details  cephALEXin (KEFLEX) 500 MG capsule Take 1 capsule (500 mg total) by mouth 4 (four) times daily., Starting Sun 09/25/2017, Print         Cristina Gong, PA-C 09/25/17 2107    Nira Conn, MD 09/28/17 [phone]

## 2018-05-19 ENCOUNTER — Encounter (HOSPITAL_COMMUNITY): Payer: Self-pay

## 2018-05-19 ENCOUNTER — Other Ambulatory Visit: Payer: Self-pay

## 2018-05-19 ENCOUNTER — Emergency Department (HOSPITAL_COMMUNITY): Payer: Medicaid Other

## 2018-05-19 ENCOUNTER — Emergency Department (HOSPITAL_COMMUNITY)
Admission: EM | Admit: 2018-05-19 | Discharge: 2018-05-19 | Disposition: A | Discharge status: Home / Self Care | Payer: Medicaid Other | Attending: Emergency Medicine | Admitting: Emergency Medicine

## 2018-05-19 DIAGNOSIS — M7918 Myalgia, other site: Secondary | ICD-10-CM

## 2018-05-19 DIAGNOSIS — Z87891 Personal history of nicotine dependence: Secondary | ICD-10-CM | POA: Insufficient documentation

## 2018-05-19 DIAGNOSIS — R52 Pain, unspecified: Secondary | ICD-10-CM | POA: Insufficient documentation

## 2018-05-19 DIAGNOSIS — Z79899 Other long term (current) drug therapy: Secondary | ICD-10-CM | POA: Insufficient documentation

## 2018-05-19 MED ORDER — METHOCARBAMOL 500 MG PO TABS: 500.0000 mg | ORAL_TABLET | Freq: Three times a day (TID) | ORAL | 0 refills | Status: DC | PRN

## 2018-05-19 MED ORDER — NAPROXEN 250 MG PO TABS
500.0000 mg | ORAL_TABLET | Freq: Once | ORAL | Status: AC
  Administered 2018-05-19: 500 mg via ORAL
  Filled 2018-05-19: qty 2

## 2018-05-19 MED ORDER — NAPROXEN 500 MG PO TABS: 500.0000 mg | ORAL_TABLET | Freq: Two times a day (BID) | ORAL | 0 refills | Status: DC

## 2018-05-19 NOTE — ED Provider Notes (Signed)
[facility] EMERGENCY DEPARTMENT Provider Note   CSN: [id number] Arrival date & time: 05/19/18  1323     History   Chief Complaint Chief Complaint  Patient presents with  . Tailbone Pain    HPI Dylan Tapia is a 22 y.o. male who presents the emergency department with buttocks pain that started yesterday upon waking up.  Patient states that he started a new job about 1.5 months ago he does a lot of heavy lifting.  He states that since starting the job he had lower back pain.  Yesterday morning he woke up and he had some pain in the buttocks as well.  States his pain is constant, rates it a 9 out of 10 in severity, worse with movement, no alleviating factors.  He did take 100 mg of Advil yesterday without much change.  Other than heavy lifting no other change in activity or injuries.  Denies numbness, tingling, weakness, incontinence to bowel/bladder, fever, chills, IV drug use, or hx of cancer. Denies pain in the rectum area, diarrhea, constipation, or blood in stool.   HPI  Past Medical History:  Diagnosis Date  . Asthma     There are no active problems to display for this patient.   History reviewed. No pertinent surgical history.      Home Medications    Prior to Admission medications   Medication Sig Start Date End Date Taking? Authorizing Provider  cephALEXin (KEFLEX) 500 MG capsule Take 1 capsule (500 mg total) by mouth 4 (four) times daily. 09/25/17   Cristina Gong, PA-C  ibuprofen (ADVIL,MOTRIN) 800 MG tablet Take 800 mg by mouth every 8 (eight) hours as needed for headache.    [provider]    Family History History reviewed. No pertinent family history.  Social History Social History   Tobacco Use  . Smoking status: Former Smoker    Types: Cigarettes    Last attempt to quit: 01/13/2017    Years since quitting: 1.3  . Smokeless tobacco: Never Used  Substance Use Topics  . Alcohol use: No  . Drug use: Yes    Types:  Marijuana     Allergies   Patient has no known allergies.   Review of Systems Review of Systems  Constitutional: Negative for chills and fever.  Gastrointestinal: Negative for abdominal pain, anal bleeding, blood in stool, constipation, diarrhea, nausea, rectal pain and vomiting.  Musculoskeletal: Positive for back pain.       Positive for buttocks pain  Neurological: Negative for weakness and numbness.       Negative for incontinence or saddle anesthesia    Physical Exam Updated Vital Signs BP 139/83   Pulse 90   Temp 98.5 F (36.9 C)   Resp 18   SpO2 98%   Physical Exam  Constitutional: He appears well-developed and well-nourished. No distress.  HENT:  Head: Normocephalic and atraumatic.  Eyes: Conjunctivae are normal. Right eye exhibits no discharge. Left eye exhibits no discharge.  Abdominal: Soft. He exhibits no distension. There is no tenderness. There is no rebound and no guarding.  Musculoskeletal:  No obvious deformities, appreciable swelling, erythema, ecchymosis, or open wounds. Back: There is bilateral lumbar paraspinal muscle tenderness which extends to the gluteal region, more prominent gluteal region bilaterally.  Patient does have some diffuse midline tenderness to the upper sacral area, non focal. No midline lumbar tenderness.   Neurological: He is alert.  Clear speech.  Sensation grossly intact bilateral lower extremities.  5 out  of 5 symmetric strength plantar dorsiflexion bilaterally.  Patellar DTRs are 2+ and symmetric.  Gait is steady and intact.  Skin: Skin is warm and dry.  Psychiatric: He has a normal mood and affect. His behavior is normal. Thought content normal.  Nursing note and vitals reviewed.   ED Treatments / Results  Labs (all labs ordered are listed, but only abnormal results are displayed) Labs Reviewed - No data to display  EKG None  Radiology No results found.  Procedures Procedures (including critical care  time)  Medications Ordered in ED Medications  naproxen (NAPROSYN) tablet 500 mg (500 mg Oral Given 05/19/18 1535)    Initial Impression / Assessment and Plan / ED Course  I have reviewed the triage vital signs and the nursing notes.  Pertinent labs & imaging results that were available during my care of the patient were reviewed by me and considered in my medical decision making (see chart for details).   Patient presents with pain to lower back/buttocks region, more so in upper gluteal area. Patient is nontoxic appearing, in no apparent distress, vitals WNL. Diffusely tender to lower lumbar paraspinal region as well as the gluteal region, some midline tenderness to upper portion of sacrum which is nonfocal. X-ray obtained and negative for fracture/dislocation. No associated urinary or bowel sxs. No back pain red flags. Most likely muscle strain versus spasm. Considered disc disease, UTI/pyelonephritis, kidney stone, aortic aneurysm/dissection, cauda equina or epidural abscess however these do not fit clinical picture at this time. Will treat with Naproxen and Robaxin, discussed with patient that they are not to drive or operate heavy machinery while taking Robaxin. I discussed treatment plan, need for PCP follow-up, and return precautions with the patient. Provided opportunity for questions, patient confirmed understanding and is in agreement with plan.   Final Clinical Impressions(s) / ED Diagnoses   Final diagnoses:  Gluteal pain    ED Discharge Orders        Ordered    naproxen (NAPROSYN) 500 MG tablet  2 times daily     05/19/18 1523    methocarbamol (ROBAXIN) 500 MG tablet  Every 8 hours PRN     05/19/18 1523       Naziya Hegwood, ParadiseSamantha R, PA-C 05/19/18 1552    Arby BarrettePfeiffer, Marcy, MD 05/26/18 1531

## 2018-05-19 NOTE — ED Notes (Signed)
Pt verbalized understanding discharge instructions and denies any further needs or questions at this time. VS stable, ambulatory and steady gait.   

## 2018-05-19 NOTE — ED Triage Notes (Signed)
Patient complains of coccyx pain x 1 day, does a lot of lifting at work, denies trauma, no redness no swelling, NAD

## 2018-05-19 NOTE — Discharge Instructions (Addendum)
You are seen in the emergency department today for pain in your buttocks area.  The x-ray we did did not show fractures or dislocations.  We suspect this to be muscle related at this time.  We are prescribing you 2 medications to take:  Naproxen is a nonsteroidal anti-inflammatory medication that will help with pain and swelling. Be sure to take this medication as prescribed with food, 1 pill every 12 hours,  It should be taken with food, as it can cause stomach upset, and more seriously, stomach bleeding. Do not take other nonsteroidal anti-inflammatory medications with this such as Advil, Motrin, or Aleve.   Robaxin is the muscle relaxer I have prescribed, this is meant to help with muscle tightness. Be aware that this medication may make you drowsy therefore the first time you take this it should be at a time you are in an environment where you can rest. Do not drive or operate heavy machinery when taking this medication.    We have prescribed you new medication(s) today. Discuss the medications prescribed today with your pharmacist as they can have adverse effects and interactions with your other medicines including over the counter and prescribed medications. Seek medical evaluation if you start to experience new or abnormal symptoms after taking one of these medicines, seek care immediately if you start to experience difficulty breathing, feeling of your throat closing, facial swelling, or rash as these could be indications of a more serious allergic reaction  You may take Tylenol per over-the-counter dosing instructions safely with these medicines.  Apply heat to the area.  Follow-up with your primary care provider in 1 week if your symptoms have not improved.  If you do not have a primary care provider please call the phone number circled in your discharge instructions.  Return to the ER for new or worsening symptoms or any other concerns.

## 2018-10-10 IMAGING — DX DG HAND COMPLETE 3+V*R*
3 series · 3 of 3 positions shown · non-contrast
Comparison: 07/01/2016 right hand radiograph.

CLINICAL DATA: 20 y/o M; right hand pain after punching a door with
swelling of the fourth and fifth metacarpals and metacarpophalangeal
joints.

EXAM:
RIGHT HAND - COMPLETE 3+ VIEW

[hand ap]
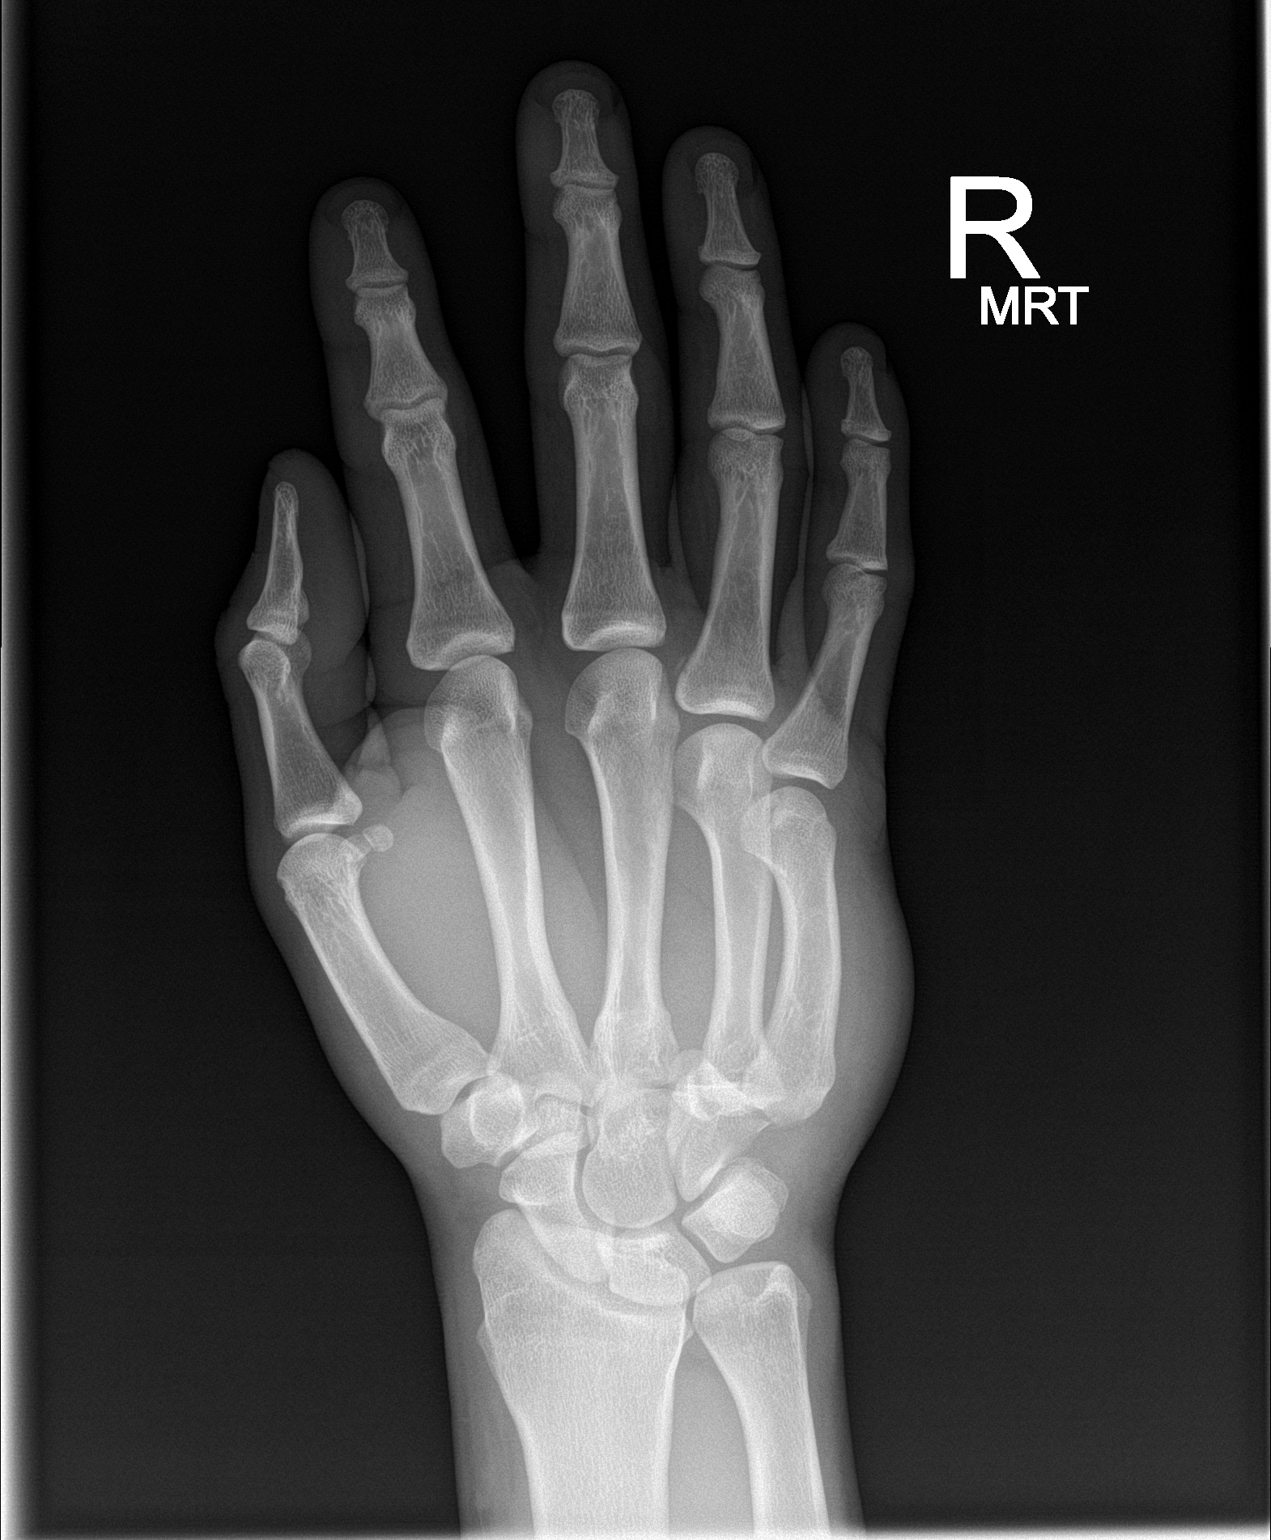

[hand obl]
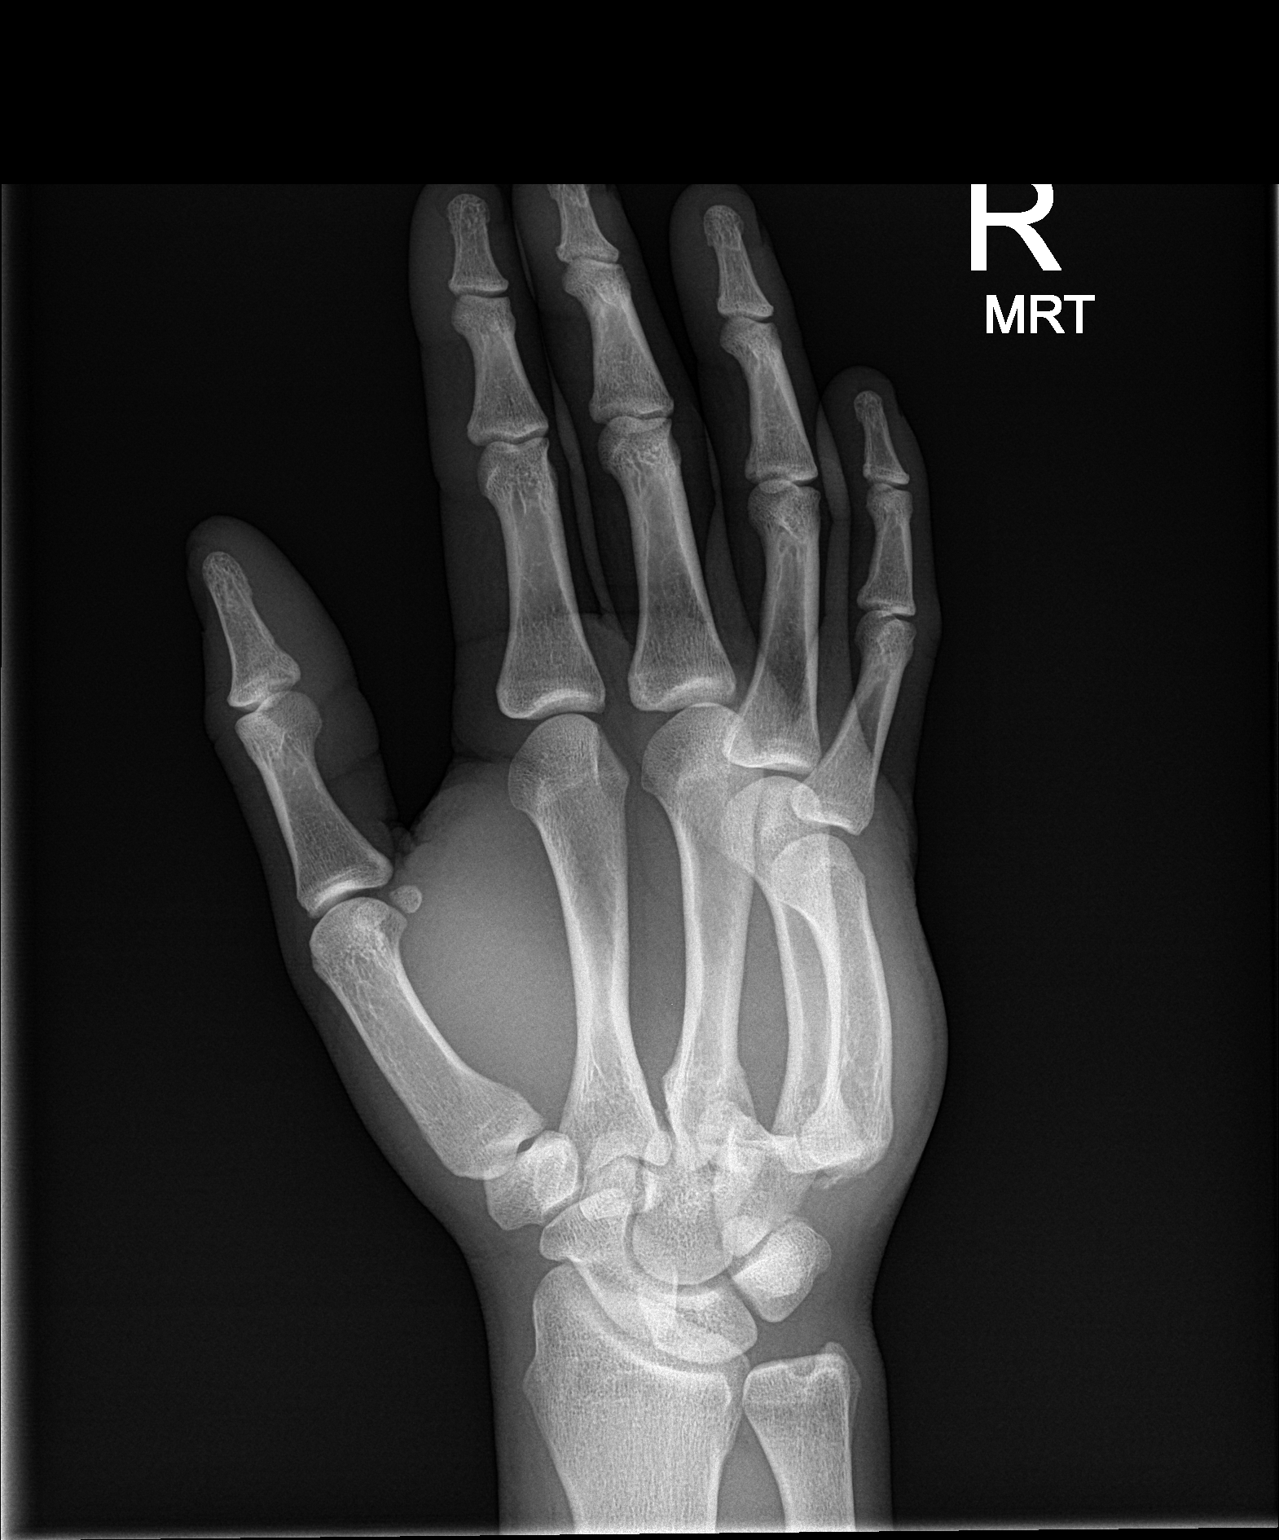

[hand lat]
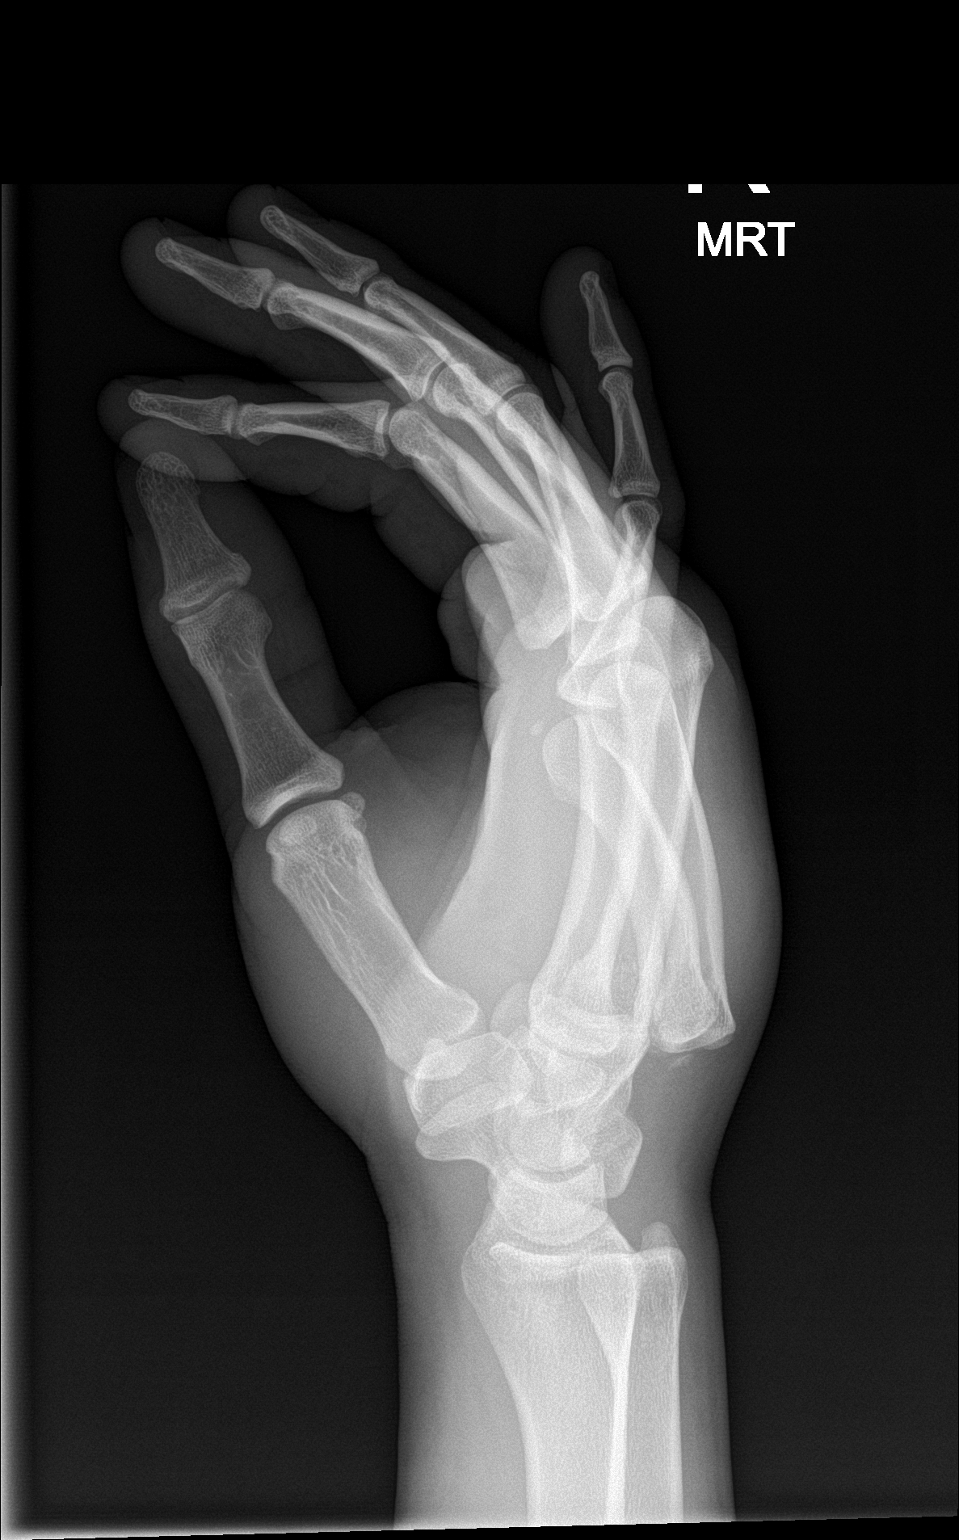

[3 of 3 positions shown; findings below may reference images not displayed]

FINDINGS: Fourth and fifth digit carpometacarpal dislocation with posterior
displacement of the metacarpals relative to the carpal bones. Bony
densities at the site of dislocation probably represent fracture
fragments. No other fracture is identified.
IMPRESSION: Fourth and fifth digit carpometacarpal dislocation with posterior
displacement of the metacarpals relative to the carpal bones. Bony
densities at the site of dislocation probably represent fracture
fragments. No other fracture is identified.

By: Rantona Bhebhe M.D.

## 2019-01-21 ENCOUNTER — Emergency Department (HOSPITAL_COMMUNITY)
Admission: EM | Admit: 2019-01-21 | Discharge: 2019-01-21 | Disposition: A | Discharge status: Home / Self Care | Payer: Medicaid Other | Attending: Emergency Medicine | Admitting: Emergency Medicine

## 2019-01-21 ENCOUNTER — Emergency Department (HOSPITAL_COMMUNITY): Payer: Medicaid Other

## 2019-01-21 ENCOUNTER — Encounter (HOSPITAL_COMMUNITY): Payer: Self-pay

## 2019-01-21 DIAGNOSIS — J45909 Unspecified asthma, uncomplicated: Secondary | ICD-10-CM | POA: Diagnosis not present

## 2019-01-21 DIAGNOSIS — M25512 Pain in left shoulder: Secondary | ICD-10-CM | POA: Insufficient documentation

## 2019-01-21 DIAGNOSIS — Z87891 Personal history of nicotine dependence: Secondary | ICD-10-CM | POA: Insufficient documentation

## 2019-01-21 MED ORDER — IBUPROFEN 800 MG PO TABS
800.0000 mg | ORAL_TABLET | Freq: Once | ORAL | Status: AC
  Administered 2019-01-21: 800 mg via ORAL
  Filled 2019-01-21: qty 1

## 2019-01-21 NOTE — ED Triage Notes (Signed)
Pt reports left shoulder pain since last night, denies injury or trauma. Pt also states he would like "a check up" When asked what type of check up he states "a sick checkup, std check" pt now reports back pain, dizziness but denies any GU symptoms. No swelling or redness noted to shoulder but pt unable to lift his arm. Pt a.o, nad. Requesting doctors note/

## 2019-01-21 NOTE — ED Provider Notes (Addendum)
[facility]   CSN: [id number] Arrival date & time: 01/21/19  1705     History   Chief Complaint Chief Complaint  Patient presents with  . Shoulder Pain    HPI Dylan Tapia is a 23 y.o. male.  HPI  23 year old male presents today complaining of left shoulder pain.  He states he began yesterday around 3:00.  He has no known injury.  Hurts with abduction, external rotation, or any other movement.  He denies any numbness or tingling.  Denies any previous similar symptoms.  He denies any chest pain, dyspnea, neck pain or neck injury, swelling, or redness.  Past Medical History:  Diagnosis Date  . Asthma     There are no active problems to display for this patient.   History reviewed. No pertinent surgical history.      Home Medications    Prior to Admission medications   Medication Sig Start Date End Date Taking? Authorizing Provider  methocarbamol (ROBAXIN) 500 MG tablet Take 1 tablet (500 mg total) by mouth every 8 (eight) hours as needed. 05/19/18   Petrucelli, Samantha R, PA-C  naproxen (NAPROSYN) 500 MG tablet Take 1 tablet (500 mg total) by mouth 2 (two) times daily. 05/19/18   Petrucelli, Pleas KochSamantha R, PA-C    Family History No family history on file.  Social History Social History   Tobacco Use  . Smoking status: Former Smoker    Types: Cigarettes    Last attempt to quit: 01/13/2017    Years since quitting: 2.0  . Smokeless tobacco: Never Used  Substance Use Topics  . Alcohol use: No  . Drug use: Yes    Types: Marijuana     Allergies   Patient has no known allergies.   Review of Systems Review of Systems  All other systems reviewed and are negative.    Physical Exam Updated Vital Signs BP (!) 170/92 (BP Location: Right Arm)   Pulse 82   Temp 98.3 F (36.8 C) (Oral)   Resp 18   SpO2 95%   Physical Exam Vitals signs and nursing note reviewed.  Constitutional:      Appearance: Normal  appearance. He is well-developed.  HENT:     Head: Normocephalic and atraumatic.     Right Ear: External ear normal.     Left Ear: External ear normal.     Nose: Nose normal.  Neck:     Trachea: No tracheal deviation.     Comments: Left shoulder with no visualized deformity Some tenderness left anterior shoulder Decreased external rotation, and abduction secondary to pain Pulses intact Sensation intact throughout upper extremity No tenderness, swelling, or decreased  range of motion of left elbow or wrist Cardiovascular:     Rate and Rhythm: Normal rate.     Pulses: Normal pulses.  Pulmonary:     Effort: Pulmonary effort is normal.  Musculoskeletal: Normal range of motion.  Skin:    General: Skin is warm and dry.     Capillary Refill: Capillary refill takes less than 2 seconds.  Neurological:     Mental Status: He is alert and oriented to person, place, and time.  Psychiatric:        Behavior: Behavior normal.      ED Treatments / Results  Labs (all labs ordered are listed, but only abnormal results are displayed) Labs Reviewed - No data to display  EKG None  Radiology No results found.  Procedures Procedures (including critical care  time)  Medications Ordered in ED Medications  ibuprofen (ADVIL,MOTRIN) tablet 800 mg (has no administration in time range)     Initial Impression / Assessment and Plan / ED Course  I have reviewed the triage vital signs and the nursing notes.  Pertinent labs & imaging results that were available during my care of the patient were reviewed by me and considered in my medical decision making (see chart for details).    23 year old male with atraumatic pain left shoulder with some decreased range of motion.  I see no signs of infection with no warmth or swelling noted.  X-rays obtained showed no evidence of fracture.  Plan sling and follow-up with orthopedic surgery.  Final Clinical Impressions(s) / ED Diagnoses   Final diagnoses:   Acute pain of left shoulder    ED Discharge Orders    None       Margarita Grizzle, MD 01/26/19 1534    Margarita Grizzle, MD 01/26/19 1534

## 2019-06-04 ENCOUNTER — Other Ambulatory Visit: Payer: Self-pay

## 2019-06-04 ENCOUNTER — Encounter (HOSPITAL_COMMUNITY): Payer: Self-pay

## 2019-06-04 ENCOUNTER — Ambulatory Visit (HOSPITAL_COMMUNITY)
Admission: EM | Admit: 2019-06-04 | Discharge: 2019-06-04 | Disposition: A | Discharge status: Home / Self Care | Payer: Medicaid Other | Attending: Physician Assistant | Admitting: Physician Assistant

## 2019-06-04 DIAGNOSIS — Z202 Contact with and (suspected) exposure to infections with a predominantly sexual mode of transmission: Secondary | ICD-10-CM | POA: Diagnosis not present

## 2019-06-04 MED ORDER — AZITHROMYCIN 250 MG PO TABS
ORAL_TABLET | ORAL | Status: AC
  Filled 2019-06-04: qty 4

## 2019-06-04 MED ORDER — CEFTRIAXONE SODIUM 250 MG IJ SOLR
INTRAMUSCULAR | Status: AC
  Filled 2019-06-04: qty 250

## 2019-06-04 MED ORDER — CEFTRIAXONE SODIUM 250 MG IJ SOLR
250.0000 mg | Freq: Once | INTRAMUSCULAR | Status: AC
  Administered 2019-06-04: 250 mg via INTRAMUSCULAR

## 2019-06-04 MED ORDER — AZITHROMYCIN 250 MG PO TABS
1000.0000 mg | ORAL_TABLET | Freq: Once | ORAL | Status: AC
  Administered 2019-06-04: 1000 mg via ORAL

## 2019-06-04 NOTE — Discharge Instructions (Signed)
Return if any problems.

## 2019-06-04 NOTE — ED Triage Notes (Signed)
Pt states it hurts when he voids. It's been off and on for 4 days this pain while voiding.

## 2019-06-04 NOTE — ED Provider Notes (Signed)
[place]    CSN: [id number] Arrival date & time: 06/04/19  0857      History   Chief Complaint Chief Complaint  Patient presents with  . SEXUALLY TRANSMITTED DISEASE    HPI Dylan Tapia is a 23 y.o. male.   The history is provided by the patient. No language interpreter was used.  Dysuria Presenting symptoms: dysuria   Relieved by:  Nothing Worsened by:  Nothing Ineffective treatments:  None tried Associated symptoms: no abdominal pain   Risk factors: no HIV     Past Medical History:  Diagnosis Date  . Asthma     There are no active problems to display for this patient.   History reviewed. No pertinent surgical history.     Home Medications    Prior to Admission medications   Medication Sig Start Date End Date Taking? Authorizing Provider  methocarbamol (ROBAXIN) 500 MG tablet Take 1 tablet (500 mg total) by mouth every 8 (eight) hours as needed. 05/19/18   Petrucelli, Samantha R, PA-C  naproxen (NAPROSYN) 500 MG tablet Take 1 tablet (500 mg total) by mouth 2 (two) times daily. 05/19/18   Petrucelli, Glynda Jaeger, PA-C    Family History History reviewed. No pertinent family history.  Social History Social History   Tobacco Use  . Smoking status: Former Smoker    Types: Cigarettes    Quit date: 01/13/2017    Years since quitting: 2.3  . Smokeless tobacco: Never Used  Substance Use Topics  . Alcohol use: No  . Drug use: Yes    Types: Marijuana     Allergies   Patient has no known allergies.   Review of Systems Review of Systems  Gastrointestinal: Negative for abdominal pain.  Genitourinary: Positive for dysuria.  All other systems reviewed and are negative.    Physical Exam Triage Vital Signs ED Triage Vitals  Enc Vitals Group     BP 06/04/19 0917 114/73     Pulse Rate 06/04/19 0917 69     Resp 06/04/19 0917 18     Temp 06/04/19 0917 97.8 F (36.6 C)     Temp src --      SpO2 06/04/19 0917 98 %     Weight 06/04/19 0915  170 lb (77.1 kg)     Height --      Head Circumference --      Peak Flow --      Pain Score 06/04/19 0915 2     Pain Loc --      Pain Edu? --      Excl. in [place]? --    No data found.  Updated Vital Signs BP 114/73 (BP Location: Right Arm)   Pulse 69   Temp 97.8 F (36.6 C)   Resp 18   Wt 77.1 kg   SpO2 98%   BMI 24.39 kg/m   Visual Acuity Right Eye Distance:   Left Eye Distance:   Bilateral Distance:    Right Eye Near:   Left Eye Near:    Bilateral Near:     Physical Exam Vitals signs and nursing note reviewed.  Constitutional:      Appearance: He is well-developed.  HENT:     Head: Normocephalic.     Right Ear: Tympanic membrane normal.  Neck:     Musculoskeletal: Normal range of motion.  Cardiovascular:     Rate and Rhythm: Normal rate.     Pulses: Normal pulses.  Pulmonary:     Effort:  Pulmonary effort is normal.  Abdominal:     General: Abdomen is flat. There is no distension.  Musculoskeletal: Normal range of motion.  Skin:    General: Skin is warm.  Neurological:     Mental Status: He is alert and oriented to person, place, and time.  Psychiatric:        Mood and Affect: Mood normal.      UC Treatments / Results  Labs (all labs ordered are listed, but only abnormal results are displayed) Labs Reviewed  RPR  HIV ANTIBODY (ROUTINE TESTING W REFLEX)  URINE CYTOLOGY ANCILLARY ONLY    EKG None  Radiology No results found.  Procedures Procedures (including critical care time)  Medications Ordered in UC Medications  cefTRIAXone (ROCEPHIN) injection 250 mg (has no administration in time range)  azithromycin (ZITHROMAX) tablet 1,000 mg (has no administration in time range)    Initial Impression / Assessment and Plan / UC Course  I have reviewed the triage vital signs and the nursing notes.  Pertinent labs & imaging results that were available during my care of the patient were reviewed by me and considered in my medical decision making  (see chart for details).    MDM  Pt given Rocephin and zithromax   Final Clinical Impressions(s) / UC Diagnoses   Final diagnoses:  Exposure to STD     Discharge Instructions     Return if any problems.    ED Prescriptions    None     Controlled Substance Prescriptions [place] Controlled Substance Registry consulted? Not Applicable   Elson AreasSofia,  K, [place] JerseyPA-C 06/04/19 1024

## 2019-06-05 LAB — HIV ANTIBODY (ROUTINE TESTING W REFLEX): HIV Screen 4th Generation wRfx: NONREACTIVE

## 2019-06-06 LAB — RPR: RPR Ser Ql: NONREACTIVE

## 2019-06-06 LAB — URINE CYTOLOGY ANCILLARY ONLY
Chlamydia: NEGATIVE
Neisseria Gonorrhea: NEGATIVE

## 2020-02-08 ENCOUNTER — Encounter (HOSPITAL_COMMUNITY): Payer: Self-pay

## 2020-02-08 ENCOUNTER — Other Ambulatory Visit: Payer: Self-pay

## 2020-02-08 ENCOUNTER — Ambulatory Visit (HOSPITAL_COMMUNITY)
Admission: EM | Admit: 2020-02-08 | Discharge: 2020-02-08 | Disposition: A | Discharge status: Home / Self Care | Payer: Medicaid Other | Attending: Family Medicine | Admitting: Family Medicine

## 2020-02-08 DIAGNOSIS — Z202 Contact with and (suspected) exposure to infections with a predominantly sexual mode of transmission: Secondary | ICD-10-CM | POA: Diagnosis present

## 2020-02-08 NOTE — ED Triage Notes (Signed)
Pt presents to UC for STD's testing. Pt denies any signs and symptoms.

## 2020-02-08 NOTE — Discharge Instructions (Signed)
Check My Chart for test results Safe sex recommended

## 2020-02-08 NOTE — ED Provider Notes (Signed)
[facility]    CSN: [id number] Arrival date & time: 02/08/20  1634      History   Chief Complaint Chief Complaint  Patient presents with  . STD test    HPI Dylan Tapia is a 24 y.o. male.   HPI   Patient is here for STD check He admits to unprotected sexual relations He denies any symptoms, dysuria, discharge, rash He denies any knowledge of exposure to infection His reason for coming in is that he likes to check periodically   Past Medical History:  Diagnosis Date  . Asthma     There are no problems to display for this patient.   History reviewed. No pertinent surgical history.     Home Medications    Prior to Admission medications   Not on File    Family History History reviewed. No pertinent family history.  Social History Social History   Tobacco Use  . Smoking status: Former Smoker    Types: Cigarettes    Quit date: 01/13/2017    Years since quitting: 3.0  . Smokeless tobacco: Never Used  Substance Use Topics  . Alcohol use: No  . Drug use: Yes    Types: Marijuana     Allergies   Patient has no known allergies.   Review of Systems Review of Systems  Genitourinary: Negative.        Denies any urinary symptoms  Skin: Negative for rash.     Physical Exam Triage Vital Signs ED Triage Vitals  Enc Vitals Group     BP 02/08/20 1643 124/76     Pulse Rate 02/08/20 1643 74     Resp 02/08/20 1643 16     Temp 02/08/20 1643 98.6 F (37 C)     Temp Source 02/08/20 1643 Oral     SpO2 02/08/20 1643 97 %     Weight --      Height --      Head Circumference --      Peak Flow --      Pain Score 02/08/20 1642 0     Pain Loc --      Pain Edu? --      Excl. in [place]? --    No data found.  Updated Vital Signs BP 124/76 (BP Location: Right Arm)   Pulse 74   Temp 98.6 F (37 C) (Oral)   Resp 16   SpO2 97%       Physical Exam Constitutional:      General: He is not in acute distress.    Appearance: Normal appearance.  He is well-developed and normal weight.  HENT:     Head: Normocephalic and atraumatic.     Mouth/Throat:     Comments: Mask in place Eyes:     Conjunctiva/sclera: Conjunctivae normal.     Pupils: Pupils are equal, round, and reactive to light.  Cardiovascular:     Rate and Rhythm: Normal rate.  Pulmonary:     Effort: Pulmonary effort is normal. No respiratory distress.  Genitourinary:    Penis: Normal.      Comments: Normal circumcised penis.  No discharge.  No lesions or rash Musculoskeletal:        General: Normal range of motion.     Cervical back: Normal range of motion.  Skin:    General: Skin is warm and dry.  Neurological:     Mental Status: He is alert.  Psychiatric:        Mood and  Affect: Mood normal.        Behavior: Behavior normal.      UC Treatments / Results  Labs (all labs ordered are listed, but only abnormal results are displayed) Labs Reviewed  CYTOLOGY, (ORAL, ANAL, URETHRAL) ANCILLARY ONLY    EKG   Radiology No results found.  Procedures Procedures (including critical care time)  Medications Ordered in UC Medications - No data to display  Initial Impression / Assessment and Plan / UC Course  I have reviewed the triage vital signs and the nursing notes.  Pertinent labs & imaging results that were available during my care of the patient were reviewed by me and considered in my medical decision making (see chart for details).      Final Clinical Impressions(s) / UC Diagnoses   Final diagnoses:  Possible exposure to STD     Discharge Instructions     Check My Chart for test results Safe sex recommended   ED Prescriptions    None     PDMP not reviewed this encounter.   Raylene Everts, MD 02/08/20 [phone]

## 2020-02-12 LAB — CYTOLOGY, (ORAL, ANAL, URETHRAL) ANCILLARY ONLY
Chlamydia: NEGATIVE
Neisseria Gonorrhea: NEGATIVE
Trichomonas: NEGATIVE

## 2020-05-02 ENCOUNTER — Encounter (HOSPITAL_COMMUNITY): Payer: Self-pay

## 2020-05-02 ENCOUNTER — Other Ambulatory Visit: Payer: Self-pay

## 2020-05-02 ENCOUNTER — Ambulatory Visit (HOSPITAL_COMMUNITY)
Admission: EM | Admit: 2020-05-02 | Discharge: 2020-05-02 | Disposition: A | Discharge status: Home / Self Care | Payer: Medicaid Other | Attending: Family Medicine | Admitting: Family Medicine

## 2020-05-02 DIAGNOSIS — N342 Other urethritis: Secondary | ICD-10-CM

## 2020-05-02 DIAGNOSIS — R300 Dysuria: Secondary | ICD-10-CM | POA: Diagnosis present

## 2020-05-02 LAB — POCT URINALYSIS DIP (DEVICE)
Bilirubin Urine: NEGATIVE
Glucose, UA: NEGATIVE mg/dL
Ketones, ur: NEGATIVE mg/dL
Nitrite: NEGATIVE
Protein, ur: NEGATIVE mg/dL
Specific Gravity, Urine: 1.025 (ref 1.005–1.030)
Urobilinogen, UA: 0.2 mg/dL (ref 0.0–1.0)
pH: 6.5 (ref 5.0–8.0)

## 2020-05-02 MED ORDER — LIDOCAINE HCL (PF) 1 % IJ SOLN
INTRAMUSCULAR | Status: AC
  Filled 2020-05-02: qty 2

## 2020-05-02 MED ORDER — CEFTRIAXONE SODIUM 500 MG IJ SOLR
500.0000 mg | Freq: Once | INTRAMUSCULAR | Status: AC
  Administered 2020-05-02: 500 mg via INTRAMUSCULAR

## 2020-05-02 MED ORDER — DOXYCYCLINE HYCLATE 100 MG PO CAPS: 100.0000 mg | ORAL_CAPSULE | Freq: Two times a day (BID) | ORAL | 0 refills | Status: DC

## 2020-05-02 MED ORDER — CEFTRIAXONE SODIUM 500 MG IJ SOLR
INTRAMUSCULAR | Status: AC
  Filled 2020-05-02: qty 500

## 2020-05-02 NOTE — ED Provider Notes (Signed)
[place]    CSN: [id number] Arrival date & time: 05/02/20  1002      History   Chief Complaint Chief Complaint  Patient presents with  . Dysuria  . STD test    HPI Dylan Tapia is a 24 y.o. male.   HPI  Patient has yellow penile discharge and dysuria since yesterday No fever or chills Is no abdominal pain No rash He does have unprotected sexual relations We discussed the value of safe sex and condoms to prevent infection of pregnancy  Past Medical History:  Diagnosis Date  . Asthma     There are no problems to display for this patient.   History reviewed. No pertinent surgical history.     Home Medications    Prior to Admission medications   Medication Sig Start Date End Date Taking? Authorizing Provider  doxycycline (VIBRAMYCIN) 100 MG capsule Take 1 capsule (100 mg total) by mouth 2 (two) times daily. 05/02/20   Raylene Everts, MD    Family History History reviewed. No pertinent family history.  Social History Social History   Tobacco Use  . Smoking status: Former Smoker    Types: Cigarettes    Quit date: 01/13/2017    Years since quitting: 3.3  . Smokeless tobacco: Never Used  Substance Use Topics  . Alcohol use: No  . Drug use: Yes    Types: Marijuana     Allergies   Patient has no known allergies.   Review of Systems Review of Systems  Genitourinary: Positive for discharge and dysuria.     Physical Exam Triage Vital Signs ED Triage Vitals  Enc Vitals Group     BP 05/02/20 1028 115/74     Pulse Rate 05/02/20 1028 72     Resp 05/02/20 1028 17     Temp 05/02/20 1028 98 F (36.7 C)     Temp Source 05/02/20 1028 Oral     SpO2 05/02/20 1028 98 %     Weight --      Height --      Head Circumference --      Peak Flow --      Pain Score 05/02/20 1026 0     Pain Loc --      Pain Edu? --      Excl. in [place]? --    No data found.  Updated Vital Signs BP 115/74 (BP Location: Left Arm)   Pulse 72   Temp 98 F  (36.7 C) (Oral)   Resp 17   SpO2 98%      Physical Exam Constitutional:      General: He is not in acute distress.    Appearance: He is well-developed.  HENT:     Head: Normocephalic and atraumatic.  Eyes:     Conjunctiva/sclera: Conjunctivae normal.     Pupils: Pupils are equal, round, and reactive to light.  Cardiovascular:     Rate and Rhythm: Normal rate.  Pulmonary:     Effort: Pulmonary effort is normal. No respiratory distress.  Abdominal:     General: There is no distension.     Palpations: Abdomen is soft.  Genitourinary:    Penis: Normal.      Comments: Normal circumcised penis. Yellow discharge present Musculoskeletal:        General: Normal range of motion.     Cervical back: Normal range of motion.  Skin:    General: Skin is warm and dry.  Neurological:  Mental Status: He is alert.  Psychiatric:        Mood and Affect: Mood normal.        Behavior: Behavior normal.      UC Treatments / Results  Labs (all labs ordered are listed, but only abnormal results are displayed) Labs Reviewed  POCT URINALYSIS DIP (DEVICE) - Abnormal; Notable for the following components:      Result Value   Hgb urine dipstick TRACE (*)    Leukocytes,Ua MODERATE (*)    All other components within normal limits  CYTOLOGY, (ORAL, ANAL, URETHRAL) ANCILLARY ONLY    EKG   Radiology No results found.  Procedures Procedures (including critical care time)  Medications Ordered in UC Medications  cefTRIAXone (ROCEPHIN) injection 500 mg (has no administration in time range)    Initial Impression / Assessment and Plan / UC Course  I have reviewed the triage vital signs and the nursing notes.  Pertinent labs & imaging results that were available during my care of the patient were reviewed by me and considered in my medical decision making (see chart for details).      Final Clinical Impressions(s) / UC Diagnoses   Final diagnoses:  Dysuria  Urethritis      Discharge Instructions     Avoid sexual encounters until you have completed the 7 days of antibiotics Your test results will be available on My Chart You will be called if any tests are positive    ED Prescriptions    Medication Sig Dispense Auth. Provider   doxycycline (VIBRAMYCIN) 100 MG capsule Take 1 capsule (100 mg total) by mouth 2 (two) times daily. 14 capsule Eustace Moore, MD     PDMP not reviewed this encounter.   Eustace Moore, MD 05/02/20 1050

## 2020-05-02 NOTE — Discharge Instructions (Addendum)
Avoid sexual encounters until you have completed the 7 days of antibiotics Your test results will be available on My Chart You will be called if any tests are positive

## 2020-05-02 NOTE — ED Triage Notes (Signed)
Pt reports white penile discharged and pain with urination x 1 day.

## 2020-05-05 LAB — CYTOLOGY, (ORAL, ANAL, URETHRAL) ANCILLARY ONLY
Chlamydia: POSITIVE — AB
Comment: NEGATIVE
Comment: NEGATIVE
Comment: NORMAL
Neisseria Gonorrhea: POSITIVE — AB
Trichomonas: NEGATIVE

## 2021-01-16 ENCOUNTER — Other Ambulatory Visit: Payer: Self-pay

## 2021-01-16 ENCOUNTER — Encounter (HOSPITAL_COMMUNITY): Payer: Self-pay

## 2021-01-16 ENCOUNTER — Ambulatory Visit (HOSPITAL_COMMUNITY)
Admission: EM | Admit: 2021-01-16 | Discharge: 2021-01-16 | Disposition: A | Discharge status: Home / Self Care | Payer: Medicaid Other | Attending: Emergency Medicine | Admitting: Emergency Medicine

## 2021-01-16 DIAGNOSIS — Z202 Contact with and (suspected) exposure to infections with a predominantly sexual mode of transmission: Secondary | ICD-10-CM | POA: Diagnosis not present

## 2021-01-16 MED ORDER — DOXYCYCLINE HYCLATE 100 MG PO CAPS: 100.0000 mg | ORAL_CAPSULE | Freq: Two times a day (BID) | ORAL | 0 refills | Status: AC

## 2021-01-16 NOTE — ED Provider Notes (Signed)
[facility]    CSN: [id number] Arrival date & time: 01/16/21  1116      History   Chief Complaint Chief Complaint  Patient presents with  . Exposure to STD    HPI Dylan Tapia is a 25 y.o. male.   Patient presents with request for STD testing and treatment.  He states his sexual partner tested positive for chlamydia.  He denies symptoms, including penile discharge, dysuria, abdominal pain, back pain, testicular pain, rash, lesions, or other symptoms.  No treatments attempted at home.  Patient was positive for gonorrhea and chlamydia in May 2021.  The history is provided by the patient and medical records.    Past Medical History:  Diagnosis Date  . Asthma     There are no problems to display for this patient.   History reviewed. No pertinent surgical history.     Home Medications    Prior to Admission medications   Medication Sig Start Date End Date Taking? Authorizing Provider  doxycycline (VIBRAMYCIN) 100 MG capsule Take 1 capsule (100 mg total) by mouth 2 (two) times daily for 7 days. 01/16/21 01/23/21 Yes Mickie Bail, NP    Family History History reviewed. No pertinent family history.  Social History Social History   Tobacco Use  . Smoking status: Former Smoker    Types: Cigarettes    Quit date: 01/13/2017    Years since quitting: 4.0  . Smokeless tobacco: Never Used  Substance Use Topics  . Alcohol use: No  . Drug use: Yes    Types: Marijuana     Allergies   Patient has no known allergies.   Review of Systems Review of Systems  Constitutional: Negative for chills and fever.  HENT: Negative for ear pain and sore throat.   Eyes: Negative for pain and visual disturbance.  Respiratory: Negative for cough and shortness of breath.   Cardiovascular: Negative for chest pain and palpitations.  Gastrointestinal: Negative for abdominal pain and vomiting.  Genitourinary: Negative for dysuria, flank pain, hematuria, penile discharge and  testicular pain.  Musculoskeletal: Negative for arthralgias and back pain.  Skin: Negative for color change and rash.  Neurological: Negative for seizures and syncope.  All other systems reviewed and are negative.    Physical Exam Triage Vital Signs ED Triage Vitals [01/16/21 1152]  Enc Vitals Group     BP      Pulse      Resp      Temp      Temp src      SpO2      Weight      Height      Head Circumference      Peak Flow      Pain Score 0     Pain Loc      Pain Edu?      Excl. in [place]?    No data found.  Updated Vital Signs BP 136/87   Pulse 69   Temp 98.2 F (36.8 C)   Resp 18   SpO2 99%   Visual Acuity Right Eye Distance:   Left Eye Distance:   Bilateral Distance:    Right Eye Near:   Left Eye Near:    Bilateral Near:     Physical Exam Vitals and nursing note reviewed.  Constitutional:      General: He is not in acute distress.    Appearance: He is well-developed and well-nourished. He is not ill-appearing.  HENT:  Head: Normocephalic and atraumatic.  Eyes:     Conjunctiva/sclera: Conjunctivae normal.  Cardiovascular:     Rate and Rhythm: Normal rate and regular rhythm.     Heart sounds: Normal heart sounds.  Pulmonary:     Effort: Pulmonary effort is normal. No respiratory distress.     Breath sounds: Normal breath sounds.  Abdominal:     Palpations: Abdomen is soft.     Tenderness: There is no abdominal tenderness. There is no right CVA tenderness, left CVA tenderness, guarding or rebound.  Musculoskeletal:        General: No edema.     Cervical back: Neck supple.  Skin:    General: Skin is warm and dry.  Neurological:     General: No focal deficit present.     Mental Status: He is alert and oriented to person, place, and time.     Gait: Gait normal.  Psychiatric:        Mood and Affect: Mood and affect and mood normal.        Behavior: Behavior normal.      UC Treatments / Results  Labs (all labs ordered are listed, but only  abnormal results are displayed) Labs Reviewed  CYTOLOGY, (ORAL, ANAL, URETHRAL) ANCILLARY ONLY    EKG   Radiology No results found.  Procedures Procedures (including critical care time)  Medications Ordered in UC Medications - No data to display  Initial Impression / Assessment and Plan / UC Course  I have reviewed the triage vital signs and the nursing notes.  Pertinent labs & imaging results that were available during my care of the patient were reviewed by me and considered in my medical decision making (see chart for details).   Exposure to STD, chlamydia.  Patient obtained urethral self swab for testing.  Treating with doxycycline.  Instructed patient to abstain from sexual activity from the next 7 days.  Discussed that we will call him if his test results are positive requiring additional treatment.  Discussed that his sexual partner may also require additional treatment at that time.  Patient agrees to plan of care.   Final Clinical Impressions(s) / UC Diagnoses   Final diagnoses:  Exposure to STD     Discharge Instructions     Take the doxycycline twice a day for 7 days.    Your penile tests are pending.  If your test results are positive, we will call you.  You and your sexual partner(s) may require treatment at that time.  Do not have sexual activity until the test results are back.    Follow up with your primary care provider as needed.         ED Prescriptions    Medication Sig Dispense Auth. Provider   doxycycline (VIBRAMYCIN) 100 MG capsule Take 1 capsule (100 mg total) by mouth 2 (two) times daily for 7 days. 14 capsule Mickie Bail, NP     PDMP not reviewed this encounter.   Mickie Bail, NP 01/16/21 [phone]

## 2021-01-16 NOTE — ED Triage Notes (Signed)
Pt in with c/o STD exposure. States that his sexual partner recently tested positive for chlamydia  Denies any sxs

## 2021-01-16 NOTE — Discharge Instructions (Signed)
Take the doxycycline twice a day for 7 days.    Your penile tests are pending.  If your test results are positive, we will call you.  You and your sexual partner(s) may require treatment at that time.  Do not have sexual activity until the test results are back.    Follow up with your primary care provider as needed.

## 2021-01-19 LAB — CYTOLOGY, (ORAL, ANAL, URETHRAL) ANCILLARY ONLY
Chlamydia: NEGATIVE
Comment: NEGATIVE
Comment: NEGATIVE
Comment: NORMAL
Neisseria Gonorrhea: NEGATIVE
Trichomonas: NEGATIVE

## 2021-08-21 ENCOUNTER — Encounter (HOSPITAL_COMMUNITY): Payer: Self-pay | Admitting: Emergency Medicine

## 2021-08-21 ENCOUNTER — Ambulatory Visit (HOSPITAL_COMMUNITY)
Admission: EM | Admit: 2021-08-21 | Discharge: 2021-08-21 | Disposition: A | Discharge status: Home / Self Care | Payer: Medicaid Other | Attending: Emergency Medicine | Admitting: Emergency Medicine

## 2021-08-21 ENCOUNTER — Other Ambulatory Visit: Payer: Self-pay

## 2021-08-21 DIAGNOSIS — Z113 Encounter for screening for infections with a predominantly sexual mode of transmission: Secondary | ICD-10-CM | POA: Insufficient documentation

## 2021-08-21 DIAGNOSIS — Z202 Contact with and (suspected) exposure to infections with a predominantly sexual mode of transmission: Secondary | ICD-10-CM | POA: Diagnosis not present

## 2021-08-21 LAB — HIV ANTIBODY (ROUTINE TESTING W REFLEX): HIV Screen 4th Generation wRfx: NONREACTIVE

## 2021-08-21 NOTE — Discharge Instructions (Addendum)
We will contact you if the results from your lab work are positive and require additional treatment.    Do not have sex while taking undergoing treatment for STI.  Make sure that all of your partners get tested and treated.   Use a condom or other barrier method for all sexual encounters.    Return or go to the Emergency Department if symptoms worsen or do not improve in the next few days.  

## 2021-08-21 NOTE — ED Provider Notes (Signed)
[facility]    CSN: [id number] Arrival date & time: 08/21/21  1055      History   Chief Complaint Chief Complaint  Patient presents with   Exposure to STD    HPI Dylan Tapia is a 25 y.o. male.   Patient here for evaluation of possible exposure to STIs.  Reports partner recently tested positive but he is unsure of which one.  Requesting STD screening including HIV and syphilis.  Denies any dysuria, urgency, frequency, penile pain, or discharge.  Denies any trauma, injury, or other precipitating event.  Denies any specific alleviating or aggravating factors.  Denies any fevers, chest pain, shortness of breath, N/V/D, numbness, tingling, weakness, abdominal pain, or headaches.    The history is provided by the patient.  Exposure to STD   Past Medical History:  Diagnosis Date   Asthma     There are no problems to display for this patient.   History reviewed. No pertinent surgical history.     Home Medications    Prior to Admission medications   Not on File    Family History History reviewed. No pertinent family history.  Social History Social History   Tobacco Use   Smoking status: Former    Types: Cigarettes    Quit date: 01/13/2017    Years since quitting: 4.6   Smokeless tobacco: Never  Substance Use Topics   Alcohol use: No   Drug use: Yes    Types: Marijuana     Allergies   Patient has no known allergies.   Review of Systems Review of Systems  All other systems reviewed and are negative.   Physical Exam Triage Vital Signs ED Triage Vitals  Enc Vitals Group     BP 08/21/21 1134 123/81     Pulse Rate 08/21/21 1134 66     Resp 08/21/21 1134 15     Temp 08/21/21 1134 98.4 F (36.9 C)     Temp Source 08/21/21 1134 Oral     SpO2 08/21/21 1134 96 %     Weight --      Height --      Head Circumference --      Peak Flow --      Pain Score 08/21/21 1133 0     Pain Loc --      Pain Edu? --      Excl. in GC? --    No data  found.  Updated Vital Signs BP 123/81 (BP Location: Right Arm)   Pulse 66   Temp 98.4 F (36.9 C) (Oral)   Resp 15   SpO2 96%   Visual Acuity Right Eye Distance:   Left Eye Distance:   Bilateral Distance:    Right Eye Near:   Left Eye Near:    Bilateral Near:     Physical Exam Vitals and nursing note reviewed.  Constitutional:      General: He is not in acute distress.    Appearance: Normal appearance. He is not ill-appearing, toxic-appearing or diaphoretic.  HENT:     Head: Normocephalic and atraumatic.  Eyes:     Conjunctiva/sclera: Conjunctivae normal.  Cardiovascular:     Rate and Rhythm: Normal rate.     Pulses: Normal pulses.  Pulmonary:     Effort: Pulmonary effort is normal.  Abdominal:     General: Abdomen is flat.  Genitourinary:    Comments: declines Musculoskeletal:        General: Normal range of motion.  Cervical back: Normal range of motion.  Skin:    General: Skin is warm and dry.  Neurological:     General: No focal deficit present.     Mental Status: He is alert and oriented to person, place, and time.  Psychiatric:        Mood and Affect: Mood normal.     UC Treatments / Results  Labs (all labs ordered are listed, but only abnormal results are displayed) Labs Reviewed  RPR  HIV ANTIBODY (ROUTINE TESTING W REFLEX)  CYTOLOGY, (ORAL, ANAL, URETHRAL) ANCILLARY ONLY    EKG   Radiology No results found.  Procedures Procedures (including critical care time)  Medications Ordered in UC Medications - No data to display  Initial Impression / Assessment and Plan / UC Course  I have reviewed the triage vital signs and the nursing notes.  Pertinent labs & imaging results that were available during my care of the patient were reviewed by me and considered in my medical decision making (see chart for details).    Assessment negative for red flags or concerns.  Self swab obtained and will treat based on results.  RPR and HIV pending.   Discussed safe sex practices including condoms or other barrier method use.  Discussed that patient needs to remain abstinent while awaiting results.  Follow-up as needed Final Clinical Impressions(s) / UC Diagnoses   Final diagnoses:  Possible exposure to STD  Screen for STD (sexually transmitted disease)     Discharge Instructions      We will contact you if the results from your lab work are positive and require additional treatment.    Do not have sex while taking undergoing treatment for STI.  Make sure that all of your partners get tested and treated.   Use a condom or other barrier method for all sexual encounters.    Return or go to the Emergency Department if symptoms worsen or do not improve in the next few days.      ED Prescriptions   None    PDMP not reviewed this encounter.   Ivette Loyal, NP 08/21/21 1149

## 2021-08-21 NOTE — ED Triage Notes (Signed)
Patient c/o potential STD exposure.   Patient states " she said something about maybe having chlamydia".   Patient denies any symptoms.

## 2021-08-22 LAB — RPR: RPR Ser Ql: NONREACTIVE

## 2021-08-25 LAB — CYTOLOGY, (ORAL, ANAL, URETHRAL) ANCILLARY ONLY
Chlamydia: NEGATIVE
Comment: NEGATIVE
Comment: NEGATIVE
Comment: NORMAL
Neisseria Gonorrhea: NEGATIVE
Trichomonas: NEGATIVE

## 2021-11-03 ENCOUNTER — Other Ambulatory Visit: Payer: Self-pay

## 2021-11-03 ENCOUNTER — Ambulatory Visit (HOSPITAL_COMMUNITY)
Admission: RE | Admit: 2021-11-03 | Discharge: 2021-11-03 | Disposition: A | Discharge status: Home / Self Care | Payer: Medicaid Other | Source: Ambulatory Visit | Attending: Student | Admitting: Student

## 2021-11-03 ENCOUNTER — Encounter (HOSPITAL_COMMUNITY): Payer: Self-pay

## 2021-11-03 VITALS — BP 145/97 | HR 68 | Temp 97.9°F | Resp 19

## 2021-11-03 DIAGNOSIS — R300 Dysuria: Secondary | ICD-10-CM | POA: Insufficient documentation

## 2021-11-03 DIAGNOSIS — Z113 Encounter for screening for infections with a predominantly sexual mode of transmission: Secondary | ICD-10-CM | POA: Insufficient documentation

## 2021-11-03 MED ORDER — CEFTRIAXONE SODIUM 500 MG IJ SOLR
500.0000 mg | Freq: Once | INTRAMUSCULAR | Status: AC
  Administered 2021-11-03: 500 mg via INTRAMUSCULAR

## 2021-11-03 MED ORDER — CEFTRIAXONE SODIUM 500 MG IJ SOLR
INTRAMUSCULAR | Status: AC
  Filled 2021-11-03: qty 500

## 2021-11-03 MED ORDER — DOXYCYCLINE HYCLATE 100 MG PO CAPS: 100.0000 mg | ORAL_CAPSULE | Freq: Two times a day (BID) | ORAL | 0 refills | Status: AC

## 2021-11-03 MED ORDER — LIDOCAINE HCL (PF) 1 % IJ SOLN
INTRAMUSCULAR | Status: AC
  Filled 2021-11-03: qty 4

## 2021-11-03 MED ORDER — METRONIDAZOLE 500 MG PO TABS: 2000.0000 mg | ORAL_TABLET | Freq: Once | ORAL | 0 refills | Status: AC

## 2021-11-03 NOTE — Discharge Instructions (Addendum)
-  We treated you for gonorrhea today with a shot of an antibiotic called Rocephin -For chlamydia- Doxycycline twice daily for 7 days.  Make sure to wear sunscreen while spending time outside while on this medication as it can increase your chance of sunburn. You can take this medication with food if you have a sensitive stomach. -For trichomonas- flagyl four pills taken at once (or in the same day) -We'll call in about 2 days with any positive lab results. -Abstain from intercourse until treatment is complete.

## 2021-11-03 NOTE — ED Triage Notes (Signed)
Pt presents with painful urination X 3 weeks. Pt states he has had stomach pain. States he is constantly using the bathroom.

## 2021-11-03 NOTE — ED Provider Notes (Signed)
[facility]    CSN: [id number] Arrival date & time: 11/03/21  1546      History   Chief Complaint Chief Complaint  Patient presents with   SEXUALLY TRANSMITTED DISEASE   Appointment    HPI Dylan Tapia is a 25 y.o. male presenting with dysuria for 3 weeks.  Medical history noncontributory. patient describes intercourse with 2 new male partners, he now wants to enter a monogamous relationship with one of them and so is requesting STI screening.  States that the last 2 times he went to urgent care, he did the self swab himself, and was negative, so he wants a provider to swab him today.  Endorses 3 weeks of dysuria, without penile discharge or genital lesions.  Intermittent lower abdominal pain, without flank pain or fever/chills.  HPI  Past Medical History:  Diagnosis Date   Asthma     There are no problems to display for this patient.   History reviewed. No pertinent surgical history.     Home Medications    Prior to Admission medications   Medication Sig Start Date End Date Taking? Authorizing Provider  doxycycline (VIBRAMYCIN) 100 MG capsule Take 1 capsule (100 mg total) by mouth 2 (two) times daily for 7 days. 11/03/21 11/10/21 Yes Rhys Martini, PA-C  metroNIDAZOLE (FLAGYL) 500 MG tablet Take 4 tablets (2,000 mg total) by mouth once for 1 dose. Avoid alcohol while taking this medication and for 2 days after 11/03/21 11/03/21 Yes Rhys Martini, PA-C    Family History History reviewed. No pertinent family history.  Social History Social History   Tobacco Use   Smoking status: Former    Types: Cigarettes    Quit date: 01/13/2017    Years since quitting: 4.8   Smokeless tobacco: Never  Substance Use Topics   Alcohol use: No   Drug use: Yes    Types: Marijuana     Allergies   Patient has no known allergies.   Review of Systems Review of Systems  Constitutional:  Negative for chills and fever.  HENT:  Negative for sore throat.    Eyes:  Negative for pain and redness.  Respiratory:  Negative for shortness of breath.   Cardiovascular:  Negative for chest pain.  Gastrointestinal:  Negative for abdominal pain, diarrhea, nausea and vomiting.  Genitourinary:  Positive for dysuria. Negative for decreased urine volume, difficulty urinating, flank pain, frequency, genital sores, hematuria and urgency.  Musculoskeletal:  Negative for back pain.  Skin:  Negative for rash.  All other systems reviewed and are negative.   Physical Exam Triage Vital Signs ED Triage Vitals  Enc Vitals Group     BP 11/03/21 1621 (!) 145/97     Pulse Rate 11/03/21 1620 68     Resp 11/03/21 1620 19     Temp 11/03/21 1620 97.9 F (36.6 C)     Temp Source 11/03/21 1620 Oral     SpO2 11/03/21 1620 100 %     Weight --      Height --      Head Circumference --      Peak Flow --      Pain Score 11/03/21 1618 0     Pain Loc --      Pain Edu? --      Excl. in GC? --    No data found.  Updated Vital Signs BP (!) 145/97   Pulse 68   Temp 97.9 F (36.6 C) (Oral)   Resp  19   SpO2 100%   Visual Acuity Right Eye Distance:   Left Eye Distance:   Bilateral Distance:    Right Eye Near:   Left Eye Near:    Bilateral Near:     Physical Exam Vitals reviewed. Exam conducted with a chaperone present.  Constitutional:      General: He is not in acute distress.    Appearance: Normal appearance. He is not ill-appearing.  HENT:     Head: Normocephalic and atraumatic.     Mouth/Throat:     Mouth: Mucous membranes are moist.     Comments: Moist mucous membranes Eyes:     Extraocular Movements: Extraocular movements intact.     Pupils: Pupils are equal, round, and reactive to light.  Cardiovascular:     Rate and Rhythm: Normal rate and regular rhythm.     Heart sounds: Normal heart sounds.  Pulmonary:     Effort: Pulmonary effort is normal.     Breath sounds: Normal breath sounds. No wheezing, rhonchi or rales.  Abdominal:     General:  Bowel sounds are normal. There is no distension.     Palpations: Abdomen is soft. There is no mass.     Tenderness: There is no abdominal tenderness. There is no right CVA tenderness, left CVA tenderness, guarding or rebound.  Genitourinary:    Penis: Normal and circumcised. No phimosis, paraphimosis, hypospadias, erythema, tenderness, discharge, swelling or lesions.      Testes: Normal.        Right: Mass, tenderness, swelling, testicular hydrocele or varicocele not present. Right testis is descended. Cremasteric reflex is present.         Left: Mass, tenderness, swelling, testicular hydrocele or varicocele not present. Left testis is descended. Cremasteric reflex is present.      Epididymis:     Right: Normal.     Left: Normal.     Comments: Chaperone: Torrie CMA  Skin:    General: Skin is warm.     Capillary Refill: Capillary refill takes less than 2 seconds.     Comments: Good skin turgor  Neurological:     General: No focal deficit present.     Mental Status: He is alert and oriented to person, place, and time.  Psychiatric:        Mood and Affect: Mood normal.        Behavior: Behavior normal.     UC Treatments / Results  Labs (all labs ordered are listed, but only abnormal results are displayed) Labs Reviewed  CYTOLOGY, (ORAL, ANAL, URETHRAL) ANCILLARY ONLY    EKG   Radiology No results found.  Procedures Procedures (including critical care time)  Medications Ordered in UC Medications  cefTRIAXone (ROCEPHIN) injection 500 mg (500 mg Intramuscular Given 11/03/21 1654)    Initial Impression / Assessment and Plan / UC Course  I have reviewed the triage vital signs and the nursing notes.  Pertinent labs & imaging results that were available during my care of the patient were reviewed by me and considered in my medical decision making (see chart for details).     This patient is a very pleasant 25 y.o. year old male presenting with STI screen - dysuria.  Afebrile, nontachycardic, no reproducible abd pain or CVAT.  I collected swab for G/C, trich. Declines HIV, RPR. Safe sex precautions.   Patient wishes to treat today with Rocephin, Flagyl, Doxycycline.   ED return precautions discussed. Patient verbalizes understanding and agreement.    Final  Clinical Impressions(s) / UC Diagnoses   Final diagnoses:  Dysuria  Routine screening for STI (sexually transmitted infection)     Discharge Instructions      -We treated you for gonorrhea today with a shot of an antibiotic called Rocephin -For chlamydia- Doxycycline twice daily for 7 days.  Make sure to wear sunscreen while spending time outside while on this medication as it can increase your chance of sunburn. You can take this medication with food if you have a sensitive stomach. -For trichomonas- flagyl four pills taken at once (or in the same day) -We'll call in about 2 days with any positive lab results. -Abstain from intercourse until treatment is complete.      ED Prescriptions     Medication Sig Dispense Auth. Provider   doxycycline (VIBRAMYCIN) 100 MG capsule Take 1 capsule (100 mg total) by mouth 2 (two) times daily for 7 days. 14 capsule Hazel Sams, PA-C   metroNIDAZOLE (FLAGYL) 500 MG tablet Take 4 tablets (2,000 mg total) by mouth once for 1 dose. Avoid alcohol while taking this medication and for 2 days after 4 tablet Hazel Sams, PA-C      PDMP not reviewed this encounter.   Hazel Sams, PA-C 11/03/21 1659

## 2021-11-04 LAB — CYTOLOGY, (ORAL, ANAL, URETHRAL) ANCILLARY ONLY
Chlamydia: POSITIVE — AB
Comment: NEGATIVE
Comment: NEGATIVE
Comment: NORMAL
Neisseria Gonorrhea: NEGATIVE
Trichomonas: NEGATIVE

## 2022-01-12 ENCOUNTER — Ambulatory Visit (HOSPITAL_COMMUNITY)
Admission: RE | Admit: 2022-01-12 | Discharge: 2022-01-12 | Disposition: A | Discharge status: Home / Self Care | Payer: Medicaid Other | Source: Ambulatory Visit | Attending: Student | Admitting: Student

## 2022-01-12 ENCOUNTER — Other Ambulatory Visit: Payer: Self-pay

## 2022-01-12 ENCOUNTER — Encounter (HOSPITAL_COMMUNITY): Payer: Self-pay

## 2022-01-12 VITALS — BP 132/80 | HR 65 | Temp 98.0°F | Resp 18

## 2022-01-12 DIAGNOSIS — R369 Urethral discharge, unspecified: Secondary | ICD-10-CM | POA: Insufficient documentation

## 2022-01-12 DIAGNOSIS — Z113 Encounter for screening for infections with a predominantly sexual mode of transmission: Secondary | ICD-10-CM | POA: Insufficient documentation

## 2022-01-12 DIAGNOSIS — Z202 Contact with and (suspected) exposure to infections with a predominantly sexual mode of transmission: Secondary | ICD-10-CM | POA: Insufficient documentation

## 2022-01-12 MED ORDER — DOXYCYCLINE HYCLATE 100 MG PO CAPS: 100.0000 mg | ORAL_CAPSULE | Freq: Two times a day (BID) | ORAL | 0 refills | Status: AC

## 2022-01-12 MED ORDER — CEFTRIAXONE SODIUM 500 MG IJ SOLR
500.0000 mg | Freq: Once | INTRAMUSCULAR | Status: AC
  Administered 2022-01-12: 09:00:00 500 mg via INTRAMUSCULAR

## 2022-01-12 MED ORDER — CEFTRIAXONE SODIUM 500 MG IJ SOLR
INTRAMUSCULAR | Status: AC
  Filled 2022-01-12: qty 500

## 2022-01-12 MED ORDER — LIDOCAINE HCL (PF) 1 % IJ SOLN
INTRAMUSCULAR | Status: AC
  Filled 2022-01-12: qty 2

## 2022-01-12 NOTE — ED Triage Notes (Signed)
Pt c/o penile discharge x1 wk. States had a exposure to gonorrhea.

## 2022-01-12 NOTE — ED Provider Notes (Signed)
[facility]    CSN: [id number] Arrival date & time: 01/12/22  [id number]      History   Chief Complaint Chief Complaint  Patient presents with   SEXUALLY TRANSMITTED DISEASE    HPI Dylan Tapia is a 26 y.o. male presenting with yellow penile discharge and dysuria x2 weeks. Medical history STI. Exposure to gonorrhea. Dysuria. Denies hematuria, frequency, urgency, back pain, n/v/d/abd pain, fevers/chills, abdnormal penile rashes/lesions.  HPI  Past Medical History:  Diagnosis Date   Asthma     There are no problems to display for this patient.   History reviewed. No pertinent surgical history.     Home Medications    Prior to Admission medications   Medication Sig Start Date End Date Taking? Authorizing Provider  doxycycline (VIBRAMYCIN) 100 MG capsule Take 1 capsule (100 mg total) by mouth 2 (two) times daily for 7 days. 01/12/22 01/19/22 Yes Hazel Sams, PA-C    Family History History reviewed. No pertinent family history.  Social History Social History   Tobacco Use   Smoking status: Former    Types: Cigarettes    Quit date: 01/13/2017    Years since quitting: 5.0   Smokeless tobacco: Never  Substance Use Topics   Alcohol use: No   Drug use: Yes    Types: Marijuana     Allergies   Patient has no known allergies.   Review of Systems Review of Systems  Constitutional:  Negative for chills and fever.  HENT:  Negative for sore throat.   Eyes:  Negative for pain and redness.  Respiratory:  Negative for shortness of breath.   Cardiovascular:  Negative for chest pain.  Gastrointestinal:  Negative for abdominal pain, diarrhea, nausea and vomiting.  Genitourinary:  Positive for dysuria. Negative for decreased urine volume, difficulty urinating, flank pain, frequency, genital sores, hematuria and urgency.  Musculoskeletal:  Negative for back pain.  Skin:  Negative for rash.  All other systems reviewed and are negative.   Physical Exam Triage  Vital Signs ED Triage Vitals  Enc Vitals Group     BP 01/12/22 0850 132/80     Pulse Rate 01/12/22 0850 65     Resp 01/12/22 0850 18     Temp 01/12/22 0850 98 F (36.7 C)     Temp Source 01/12/22 0850 Oral     SpO2 01/12/22 0850 97 %     Weight --      Height --      Head Circumference --      Peak Flow --      Pain Score 01/12/22 0851 0     Pain Loc --      Pain Edu? --      Excl. in [place]? --    No data found.  Updated Vital Signs BP 132/80 (BP Location: Left Arm)    Pulse 65    Temp 98 F (36.7 C) (Oral)    Resp 18    SpO2 97%   Visual Acuity Right Eye Distance:   Left Eye Distance:   Bilateral Distance:    Right Eye Near:   Left Eye Near:    Bilateral Near:     Physical Exam Vitals reviewed.  Constitutional:      General: He is not in acute distress.    Appearance: Normal appearance. He is not ill-appearing.  HENT:     Head: Normocephalic and atraumatic.     Mouth/Throat:     Mouth: Mucous membranes are moist.  Comments: Moist mucous membranes Eyes:     Extraocular Movements: Extraocular movements intact.     Pupils: Pupils are equal, round, and reactive to light.  Cardiovascular:     Rate and Rhythm: Normal rate and regular rhythm.     Heart sounds: Normal heart sounds.  Pulmonary:     Effort: Pulmonary effort is normal.     Breath sounds: Normal breath sounds. No wheezing, rhonchi or rales.  Abdominal:     General: Bowel sounds are normal. There is no distension.     Palpations: Abdomen is soft. There is no mass.     Tenderness: There is no abdominal tenderness. There is no right CVA tenderness, left CVA tenderness, guarding or rebound.  Genitourinary:    Penis: Circumcised. Discharge present. No phimosis, paraphimosis, hypospadias, erythema, tenderness, swelling or lesions.      Testes: Normal.     Comments: Chaperone: Diane RN Scant yellow discharge visible urethral opening  Skin:    General: Skin is warm.     Capillary Refill: Capillary refill  takes less than 2 seconds.     Comments: Good skin turgor  Neurological:     General: No focal deficit present.     Mental Status: He is alert and oriented to person, place, and time.  Psychiatric:        Mood and Affect: Mood normal.        Behavior: Behavior normal.     UC Treatments / Results  Labs (all labs ordered are listed, but only abnormal results are displayed) Labs Reviewed  CYTOLOGY, (ORAL, ANAL, URETHRAL) ANCILLARY ONLY    EKG   Radiology No results found.  Procedures Procedures (including critical care time)  Medications Ordered in UC Medications  cefTRIAXone (ROCEPHIN) injection 500 mg (has no administration in time range)    Initial Impression / Assessment and Plan / UC Course  I have reviewed the triage vital signs and the nursing notes.  Pertinent labs & imaging results that were available during my care of the patient were reviewed by me and considered in my medical decision making (see chart for details).     This patient is a very pleasant 26 y.o. year old male presenting with penile discharge following exposure to gonorrhea.. Afebrile, nontachycardic, no reproducible abd pain or CVAT.  Exposure to gonorrhea. Will send swab for G/C, trich. Declines HIV, RPR. Safe sex precautions.   Rocephin IM administered. Also sent doxycycline to cover for chlamydia given patient preference.   ED return precautions discussed. Patient verbalizes understanding and agreement.     Final Clinical Impressions(s) / UC Diagnoses   Final diagnoses:  Exposure to gonorrhea  Penile discharge  Routine screening for STI (sexually transmitted infection)     Discharge Instructions      -We treated for gonorrhea with a shot of rocephin today -To cover for chlamydia - Doxycycline twice daily for 7 days.  Make sure to wear sunscreen while spending time outside while on this medication as it can increase your chance of sunburn. You can take this medication with food if  you have a sensitive stomach. -Abstain from intercourse until treatment is complete -Follow-up if symptoms getting worse       ED Prescriptions     Medication Sig Dispense Auth. Provider   doxycycline (VIBRAMYCIN) 100 MG capsule Take 1 capsule (100 mg total) by mouth 2 (two) times daily for 7 days. 14 capsule Hazel Sams, PA-C      PDMP not reviewed this  encounter.   Hazel Sams, PA-C 01/12/22 [phone]

## 2022-01-12 NOTE — Discharge Instructions (Addendum)
-  We treated for gonorrhea with a shot of rocephin today -To cover for chlamydia - Doxycycline twice daily for 7 days.  Make sure to wear sunscreen while spending time outside while on this medication as it can increase your chance of sunburn. You can take this medication with food if you have a sensitive stomach. -Abstain from intercourse until treatment is complete -Follow-up if symptoms getting worse

## 2022-01-13 ENCOUNTER — Telehealth (HOSPITAL_COMMUNITY): Payer: Self-pay | Admitting: Emergency Medicine

## 2022-01-13 LAB — CYTOLOGY, (ORAL, ANAL, URETHRAL) ANCILLARY ONLY
Chlamydia: POSITIVE — AB
Comment: NEGATIVE
Comment: NEGATIVE
Comment: NORMAL
Neisseria Gonorrhea: POSITIVE — AB
Trichomonas: POSITIVE — AB

## 2022-01-13 MED ORDER — METRONIDAZOLE 500 MG PO TABS: 2000.0000 mg | ORAL_TABLET | Freq: Once | ORAL | 0 refills | Status: AC

## 2022-02-16 ENCOUNTER — Ambulatory Visit (HOSPITAL_COMMUNITY): Payer: Medicaid Other

## 2022-11-04 ENCOUNTER — Ambulatory Visit (HOSPITAL_COMMUNITY)
Admission: EM | Admit: 2022-11-04 | Discharge: 2022-11-04 | Disposition: A | Discharge status: Home / Self Care | Payer: Commercial Managed Care - HMO | Attending: Internal Medicine | Admitting: Internal Medicine

## 2022-11-04 ENCOUNTER — Encounter (HOSPITAL_COMMUNITY): Payer: Self-pay

## 2022-11-04 DIAGNOSIS — R360 Urethral discharge without blood: Secondary | ICD-10-CM | POA: Diagnosis present

## 2022-11-04 MED ORDER — CEFTRIAXONE SODIUM 500 MG IJ SOLR
INTRAMUSCULAR | Status: AC
  Filled 2022-11-04: qty 500

## 2022-11-04 MED ORDER — LIDOCAINE HCL (PF) 1 % IJ SOLN
INTRAMUSCULAR | Status: AC
  Filled 2022-11-04: qty 2

## 2022-11-04 MED ORDER — CEFTRIAXONE SODIUM 500 MG IJ SOLR
500.0000 mg | INTRAMUSCULAR | Status: DC
  Administered 2022-11-04: 500 mg via INTRAMUSCULAR

## 2022-11-04 NOTE — ED Triage Notes (Signed)
Chief Complaint: Painful urination, white penile discharge. Been with the same partner, she has no symptoms.   Onset: 2 days ago  OTC medications tried: none  Sick exposure: No  Recent Travel: No

## 2022-11-04 NOTE — Discharge Instructions (Addendum)
Please abstain from sexual intercourse for 7 days to allow the medications to work We will call you with recommendation if labs are abnormal. Return to urgent care if symptoms are persistent or pain worsens

## 2022-11-06 NOTE — ED Provider Notes (Signed)
[facility]    CSN: [id number] Arrival date & time: 11/04/22  1049      History   Chief Complaint Chief Complaint  Patient presents with   Dysuria   Penile Discharge    HPI Dylan Tapia is a 26 y.o. male comes to the urgent care with a 3-day history of yellowish penile discharge.  Discharge has been persistent over the past few days and is associated with pain on urination.  Patient denies any groin pain or swelling.  No rash on the penis.  Patient engages in unprotected sexual intercourse with his wife. He is in a monogamous relationship.Marland Kitchen   HPI  Past Medical History:  Diagnosis Date   Asthma     There are no problems to display for this patient.   History reviewed. No pertinent surgical history.     Home Medications    Prior to Admission medications   Not on File    Family History History reviewed. No pertinent family history.  Social History Social History   Tobacco Use   Smoking status: Former    Types: Cigarettes    Quit date: 01/13/2017    Years since quitting: 5.8   Smokeless tobacco: Never  Substance Use Topics   Alcohol use: No   Drug use: Yes    Types: Marijuana     Allergies   Patient has no known allergies.   Review of Systems Review of Systems As per HPI  Physical Exam Triage Vital Signs ED Triage Vitals  Enc Vitals Group     BP 11/04/22 1206 101/63     Pulse Rate 11/04/22 1206 68     Resp 11/04/22 1206 16     Temp 11/04/22 1206 98 F (36.7 C)     Temp Source 11/04/22 1206 Oral     SpO2 11/04/22 1206 95 %     Weight --      Height --      Head Circumference --      Peak Flow --      Pain Score 11/04/22 1205 6     Pain Loc --      Pain Edu? --      Excl. in [place]? --    No data found.  Updated Vital Signs BP 101/63 (BP Location: Right Arm)   Pulse 68   Temp 98 F (36.7 C) (Oral)   Resp 16   SpO2 95%   Visual Acuity Right Eye Distance:   Left Eye Distance:   Bilateral Distance:    Right Eye  Near:   Left Eye Near:    Bilateral Near:     Physical Exam Genitourinary:    Penis: Normal.      Testes: Normal.     Comments: Yellowish penile discharge noted on physical exam.     UC Treatments / Results  Labs (all labs ordered are listed, but only abnormal results are displayed) Labs Reviewed  CYTOLOGY, (ORAL, ANAL, URETHRAL) ANCILLARY ONLY    EKG   Radiology No results found.  Procedures Procedures (including critical care time)  Medications Ordered in UC Medications - No data to display  Initial Impression / Assessment and Plan / UC Course  I have reviewed the triage vital signs and the nursing notes.  Pertinent labs & imaging results that were available during my care of the patient were reviewed by me and considered in my medical decision making (see chart for details).     Penile discharge: Cytology for GC/chlamydia/trichomonas  Rocephin 500 mg IM x1 dose We will call patient with recommendations if additional treatment is needed Return precautions given. Final Clinical Impressions(s) / UC Diagnoses   Final diagnoses:  Penile discharge, without blood     Discharge Instructions      Please abstain from sexual intercourse for 7 days to allow the medications to work We will call you with recommendation if labs are abnormal. Return to urgent care if symptoms are persistent or pain worsens   ED Prescriptions   None    PDMP not reviewed this encounter.   Merrilee Jansky, MD 11/06/22 [phone]

## 2022-11-08 LAB — CYTOLOGY, (ORAL, ANAL, URETHRAL) ANCILLARY ONLY
Chlamydia: NEGATIVE
Comment: NEGATIVE
Comment: NEGATIVE
Comment: NORMAL
Neisseria Gonorrhea: POSITIVE — AB
Trichomonas: NEGATIVE

## 2022-12-24 ENCOUNTER — Ambulatory Visit (HOSPITAL_COMMUNITY)
Admission: EM | Admit: 2022-12-24 | Discharge: 2022-12-24 | Disposition: A | Discharge status: Home / Self Care | Payer: Commercial Managed Care - HMO | Attending: Urgent Care | Admitting: Urgent Care

## 2022-12-24 ENCOUNTER — Encounter (HOSPITAL_COMMUNITY): Payer: Self-pay | Admitting: Emergency Medicine

## 2022-12-24 DIAGNOSIS — N342 Other urethritis: Secondary | ICD-10-CM | POA: Insufficient documentation

## 2022-12-24 MED ORDER — DOXYCYCLINE HYCLATE 100 MG PO CAPS: 100.0000 mg | ORAL_CAPSULE | Freq: Two times a day (BID) | ORAL | 0 refills | Status: AC

## 2022-12-24 MED ORDER — LIDOCAINE HCL (PF) 1 % IJ SOLN
INTRAMUSCULAR | Status: AC
  Filled 2022-12-24: qty 2

## 2022-12-24 MED ORDER — CEFTRIAXONE SODIUM 500 MG IJ SOLR
500.0000 mg | INTRAMUSCULAR | Status: DC
  Administered 2022-12-24: 500 mg via INTRAMUSCULAR

## 2022-12-24 MED ORDER — CEFTRIAXONE SODIUM 500 MG IJ SOLR
INTRAMUSCULAR | Status: AC
  Filled 2022-12-24: qty 500

## 2022-12-24 NOTE — ED Triage Notes (Signed)
Reports penile pain with drainage that is white/yellow. Reports he was treated for gonorrhea one month prior. Has since had unprotected sex with one partner since then. Denies fever, testicular pain, rash

## 2022-12-24 NOTE — Discharge Instructions (Signed)
You are being treated today for gonorrhea and chlamydia. Please avoid all forms of intercourse for the next 10 days. Please have all partners treated prior to resuming intercourse. Start doxycycline twice daily x 7 days.  Read the attached handout regarding urethritis

## 2022-12-24 NOTE — ED Provider Notes (Signed)
[facility]    CSN: [id number] Arrival date & time: 12/24/22  0920      History   Chief Complaint Chief Complaint  Patient presents with   SEXUALLY TRANSMITTED DISEASE    HPI Dylan Tapia is a 27 y.o. male.   27 year old male presents to due to concerns of urethral discharge and discomfort for the past week.  Had gonorrhea 1 month ago, completed all antibiotics as prescribed.  States symptoms feel identical.  States he has a new partner.  He denies dysuria, fever, flank pain, hematuria.  Denies symptoms of prostatitis orchitis.  No testicular pain or swelling.  No rash or lesions.     Past Medical History:  Diagnosis Date   Asthma     There are no problems to display for this patient.   History reviewed. No pertinent surgical history.     Home Medications    Prior to Admission medications   Medication Sig Start Date End Date Taking? Authorizing Provider  doxycycline (VIBRAMYCIN) 100 MG capsule Take 1 capsule (100 mg total) by mouth 2 (two) times daily for 7 days. 12/24/22 12/31/22 Yes Pate Aylward, Sherren Kerns, PA    Family History History reviewed. No pertinent family history.  Social History Social History   Tobacco Use   Smoking status: Former    Types: Cigarettes    Quit date: 01/13/2017    Years since quitting: 5.9   Smokeless tobacco: Never  Substance Use Topics   Alcohol use: No   Drug use: Yes    Types: Marijuana     Allergies   Patient has no known allergies.   Review of Systems Review of Systems As per HPI  Physical Exam Triage Vital Signs ED Triage Vitals [12/24/22 1006]  Enc Vitals Group     BP 129/84     Pulse Rate 70     Resp 16     Temp 97.6 F (36.4 C)     Temp Source Oral     SpO2 98 %     Weight      Height      Head Circumference      Peak Flow      Pain Score 6     Pain Loc      Pain Edu?      Excl. in [place]?    No data found.  Updated Vital Signs BP 129/84 (BP Location: Left Arm)   Pulse 70   Temp 97.6 F  (36.4 C) (Oral)   Resp 16   SpO2 98%   Visual Acuity Right Eye Distance:   Left Eye Distance:   Bilateral Distance:    Right Eye Near:   Left Eye Near:    Bilateral Near:     Physical Exam Vitals and nursing note reviewed.  Constitutional:      General: He is not in acute distress.    Appearance: Normal appearance. He is well-developed.  HENT:     Head: Normocephalic and atraumatic.  Eyes:     Conjunctiva/sclera: Conjunctivae normal.  Cardiovascular:     Rate and Rhythm: Normal rate.     Heart sounds: No murmur heard. Pulmonary:     Effort: Pulmonary effort is normal. No respiratory distress.  Abdominal:     Palpations: Abdomen is soft.     Tenderness: There is no abdominal tenderness.  Genitourinary:    Comments: Exam deferred Musculoskeletal:        General: No swelling.     Cervical back:  Neck supple.  Skin:    General: Skin is warm and dry.     Capillary Refill: Capillary refill takes less than 2 seconds.  Neurological:     General: No focal deficit present.     Mental Status: He is alert and oriented to person, place, and time.  Psychiatric:        Mood and Affect: Mood normal.      UC Treatments / Results  Labs (all labs ordered are listed, but only abnormal results are displayed) Labs Reviewed - No data to display  EKG   Radiology No results found.  Procedures Procedures (including critical care time)  Medications Ordered in UC Medications  cefTRIAXone (ROCEPHIN) injection 500 mg (500 mg Intramuscular Given 12/24/22 1037)    Initial Impression / Assessment and Plan / UC Course  I have reviewed the triage vital signs and the nursing notes.  Pertinent labs & imaging results that were available during my care of the patient were reviewed by me and considered in my medical decision making (see chart for details).     Urethritis -patient with known history of gonorrhea recently.  Will repeat IM Rocephin.  Will send home with doxycycline twice  daily x 7 days, patient admits to compliance with treatments in the past.  Must abstain from intercourse.  Will call with results of swabs should it require any additional treatment.  Final Clinical Impressions(s) / UC Diagnoses   Final diagnoses:  Urethritis     Discharge Instructions      You are being treated today for gonorrhea and chlamydia. Please avoid all forms of intercourse for the next 10 days. Please have all partners treated prior to resuming intercourse. Start doxycycline twice daily x 7 days.  Read the attached handout regarding urethritis    ED Prescriptions     Medication Sig Dispense Auth. Provider   doxycycline (VIBRAMYCIN) 100 MG capsule Take 1 capsule (100 mg total) by mouth 2 (two) times daily for 7 days. 14 capsule Chenell Lozon L, PA      PDMP not reviewed this encounter.   Chaney Malling, [place] 12/24/22 1050

## 2022-12-27 LAB — CYTOLOGY, (ORAL, ANAL, URETHRAL) ANCILLARY ONLY
Chlamydia: NEGATIVE
Comment: NEGATIVE
Comment: NEGATIVE
Comment: NORMAL
Neisseria Gonorrhea: POSITIVE — AB
Trichomonas: NEGATIVE

## 2023-01-28 ENCOUNTER — Ambulatory Visit (HOSPITAL_COMMUNITY)
Admission: EM | Admit: 2023-01-28 | Discharge: 2023-01-28 | Disposition: A | Discharge status: Home / Self Care | Payer: 59 | Attending: Family Medicine | Admitting: Family Medicine

## 2023-01-28 ENCOUNTER — Encounter (HOSPITAL_COMMUNITY): Payer: Self-pay

## 2023-01-28 DIAGNOSIS — Z113 Encounter for screening for infections with a predominantly sexual mode of transmission: Secondary | ICD-10-CM | POA: Diagnosis present

## 2023-01-28 DIAGNOSIS — N342 Other urethritis: Secondary | ICD-10-CM | POA: Diagnosis present

## 2023-01-28 LAB — HIV ANTIBODY (ROUTINE TESTING W REFLEX): HIV Screen 4th Generation wRfx: NONREACTIVE

## 2023-01-28 MED ORDER — LIDOCAINE HCL (PF) 1 % IJ SOLN
INTRAMUSCULAR | Status: AC
  Filled 2023-01-28: qty 2

## 2023-01-28 MED ORDER — CEFTRIAXONE SODIUM 500 MG IJ SOLR
INTRAMUSCULAR | Status: AC
  Filled 2023-01-28: qty 500

## 2023-01-28 MED ORDER — CEFTRIAXONE SODIUM 500 MG IJ SOLR
500.0000 mg | INTRAMUSCULAR | Status: DC
  Administered 2023-01-28: 500 mg via INTRAMUSCULAR

## 2023-01-28 NOTE — ED Triage Notes (Signed)
Reports penile drainage white/ green. Reports it may be gonorrhea.

## 2023-01-28 NOTE — Discharge Instructions (Signed)
You have been given a shot of ceftriaxone 500 mg  Staff will notify you if there is anything positive on your swab or the blood work  If your MyChart sends you a message that you have a positive test, please call in to our facility this weekend.  We do not have a callback nurse on weekends.

## 2023-01-28 NOTE — ED Provider Notes (Signed)
Dylan Tapia    CSN: [id number] Arrival date & time: 01/28/23  1401      History   Chief Complaint Chief Complaint  Patient presents with   Exposure to STD    HPI Dylan Tapia is a 27 y.o. male.    Exposure to STD   Here for discharge and a little bit of dysuria.  He has not had any fever or vomiting.  No abdominal pain.  Symptoms began about 2 days ago.  He has been told by a partner that they have tested positive for gonorrhea.  On review of the chart he has tested positive for gonorrhea several times last year.  Last HIV and RPR were in late 2022.  Past Medical History:  Diagnosis Date   Asthma     There are no problems to display for this patient.   History reviewed. No pertinent surgical history.     Home Medications    Prior to Admission medications   Not on File    Family History History reviewed. No pertinent family history.  Social History Social History   Tobacco Use   Smoking status: Former    Types: Cigarettes    Quit date: 01/13/2017    Years since quitting: 6.0   Smokeless tobacco: Never  Substance Use Topics   Alcohol use: No   Drug use: Yes    Types: Marijuana     Allergies   Patient has no known allergies.   Review of Systems Review of Systems   Physical Exam Triage Vital Signs ED Triage Vitals [01/28/23 1455]  Enc Vitals Group     BP 127/84     Pulse Rate 77     Resp 18     Temp 98.4 F (36.9 C)     Temp Source Oral     SpO2 100 %     Weight      Height      Head Circumference      Peak Flow      Pain Score      Pain Loc      Pain Edu?      Excl. in [place]?    No data found.  Updated Vital Signs BP 127/84 (BP Location: Left Arm)   Pulse 77   Temp 98.4 F (36.9 C) (Oral)   Resp 18   SpO2 100%   Visual Acuity Right Eye Distance:   Left Eye Distance:   Bilateral Distance:    Right Eye Near:   Left Eye Near:    Bilateral Near:     Physical Exam Vitals reviewed.  Constitutional:       General: He is not in acute distress.    Appearance: He is not ill-appearing, toxic-appearing or diaphoretic.  HENT:     Mouth/Throat:     Mouth: Mucous membranes are moist.  Skin:    Coloration: Skin is not pale.  Neurological:     Mental Status: He is oriented to person, place, and time.  Psychiatric:        Behavior: Behavior normal.      UC Treatments / Results  Labs (all labs ordered are listed, but only abnormal results are displayed) Labs Reviewed  HIV ANTIBODY (ROUTINE TESTING W REFLEX)  RPR  CYTOLOGY, (ORAL, ANAL, URETHRAL) ANCILLARY ONLY    EKG   Radiology No results found.  Procedures Procedures (including critical care time)  Medications Ordered in UC Medications - No data to display  Initial Impression /  Assessment and Plan / UC Course  I have reviewed the triage vital signs and the nursing notes.  Pertinent labs & imaging results that were available during my care of the patient were reviewed by me and considered in my medical decision making (see chart for details).        Self swab is done, and staff will notify him of any positives on the swab or in the lab work.  He is agreeable to doing HIV and RPR screening on blood work.  We discussed safe sex practices and I asked him to please start using protection more consistently. Final diagnoses:  None   Discharge Instructions   None    ED Prescriptions   None    PDMP not reviewed this encounter.   Barrett Henle, MD 01/28/23 [phone]

## 2023-01-29 LAB — RPR: RPR Ser Ql: NONREACTIVE

## 2023-02-01 LAB — CYTOLOGY, (ORAL, ANAL, URETHRAL) ANCILLARY ONLY
Chlamydia: NEGATIVE
Comment: NEGATIVE
Comment: NEGATIVE
Comment: NORMAL
Neisseria Gonorrhea: POSITIVE — AB
Trichomonas: NEGATIVE

## 2024-01-02 ENCOUNTER — Ambulatory Visit
Admission: EM | Admit: 2024-01-02 | Discharge: 2024-01-02 | Disposition: A | Discharge status: Home / Self Care | Payer: 59 | Attending: Emergency Medicine | Admitting: Emergency Medicine

## 2024-01-02 ENCOUNTER — Encounter: Payer: Self-pay | Admitting: Emergency Medicine

## 2024-01-02 DIAGNOSIS — Z113 Encounter for screening for infections with a predominantly sexual mode of transmission: Secondary | ICD-10-CM | POA: Insufficient documentation

## 2024-01-02 NOTE — ED Triage Notes (Signed)
 Requesting STD testing. Denies known exposure or s/s.

## 2024-01-02 NOTE — Discharge Instructions (Signed)
 The results of your STD testing today which screens for gonorrhea, chlamydia, and trichomonas will be posted to your MyChart account once it is complete.  This typically takes 2 to 4 days.  Please abstain from sexual intercourse of any kind, vaginal, oral or anal, until you have received the results of your STD testing.      The results of your HIV and syphilis blood tests will be made available to you once they are complete.  They will initially be posted to your MyChart account which typically takes 2 to 3 days.      If any of your results are abnormal, you will receive a phone call regarding treatment.  Prescriptions, if any are needed, will be provided for you at your pharmacy.      Thank you for visiting [facility] today.  We appreciate the opportunity to participate in your care.

## 2024-01-02 NOTE — ED Provider Notes (Signed)
 [facility]    CSN: [id number] Arrival date & time: 01/02/24  1636    HISTORY   Chief Complaint  Patient presents with   Exposure to STD   HPI Dylan Tapia is a pleasant, 28 y.o. male who presents to urgent care today. HPI Patient requests routine STD screening.   Patient denies penile discharge, genital lesion , testicular pain or swelling, scrotal pain or swelling, perineal pain, rectal pain, pain with defecation, abdominal pain, and suprapubic pain.  Past Medical History:  Diagnosis Date   Asthma    Patient Active Problem List   Diagnosis Date Noted   Screening examination for STD (sexually transmitted disease) 01/02/2024   History reviewed. No pertinent surgical history.  Home Medications    Prior to Admission medications   Not on File    Family History No family history on file. Social History Social History   Tobacco Use   Smoking status: Former    Current packs/day: 0.00    Types: Cigarettes    Quit date: 01/13/2017    Years since quitting: 6.9   Smokeless tobacco: Never  Substance Use Topics   Alcohol use: No   Drug use: Yes    Types: Marijuana   Allergies   Patient has no known allergies.  Review of Systems Review of Systems Pertinent findings revealed after performing a 14 point review of systems has been noted in the history of present illness.  Physical Exam Vital Signs BP 122/77 (BP Location: Right Arm)   Pulse (!) 58   Temp 97.6 F (36.4 C) (Oral)   Resp 14   SpO2 99%   No data found.  Physical Exam Vitals and nursing note reviewed.  Constitutional:      General: He is not in acute distress.    Appearance: Normal appearance. He is not ill-appearing.  HENT:     Head: Normocephalic and atraumatic.  Eyes:     General: Lids are normal.        Right eye: No discharge.        Left eye: No discharge.     Extraocular Movements: Extraocular movements intact.     Conjunctiva/sclera: Conjunctivae normal.     Right eye: Right  conjunctiva is not injected.     Left eye: Left conjunctiva is not injected.  Neck:     Trachea: Trachea and phonation normal.  Cardiovascular:     Rate and Rhythm: Normal rate and regular rhythm.     Pulses: Normal pulses.     Heart sounds: Normal heart sounds. No murmur heard.    No friction rub. No gallop.  Pulmonary:     Effort: Pulmonary effort is normal. No accessory muscle usage, prolonged expiration or respiratory distress.     Breath sounds: Normal breath sounds. No stridor, decreased air movement or transmitted upper airway sounds. No decreased breath sounds, wheezing, rhonchi or rales.  Chest:     Chest wall: No tenderness.  Genitourinary:    Comments: Pt politely declines GU exam, pt did provide a penile swab for testing.   Musculoskeletal:        General: Normal range of motion.     Cervical back: Normal range of motion and neck supple. Normal range of motion.  Lymphadenopathy:     Cervical: No cervical adenopathy.  Skin:    General: Skin is warm and dry.     Findings: No erythema or rash.  Neurological:     General: No focal deficit present.  Mental Status: He is alert and oriented to person, place, and time.  Psychiatric:        Mood and Affect: Mood normal.        Behavior: Behavior normal.     Visual Acuity Right Eye Distance:   Left Eye Distance:   Bilateral Distance:    Right Eye Near:   Left Eye Near:    Bilateral Near:     UC Couse / Diagnostics / Procedures:     Radiology No results found.  Procedures Procedures (including critical care time) EKG  Pending results:  Labs Reviewed  RPR  HIV ANTIBODY (ROUTINE TESTING W REFLEX)  CYTOLOGY, (ORAL, ANAL, URETHRAL) ANCILLARY ONLY    Medications Ordered in UC: Medications - No data to display  UC Diagnoses / Final Clinical Impressions(s)   I have reviewed the triage vital signs and the nursing notes.  Pertinent labs & imaging results that were available during my care of the patient  were reviewed by me and considered in my medical decision making (see chart for details).    Final diagnoses:  Screening examination for STD (sexually transmitted disease)   STD screening was performed, patient advised that the results be posted to their MyChart and if any of the results are positive, they will be notified by phone, further treatment will be provided as indicated based on results of STD screening. Patient was advised to abstain from sexual intercourse until that they receive the results of their STD testing.  Patient was also advised to use condoms to protect themselves from STD exposure. Return precautions advised.  Drug allergies reviewed, all questions addressed.   Please see discharge instructions below for details of plan of care as provided to patient. ED Prescriptions   None    PDMP not reviewed this encounter.  Disposition Upon Discharge:  Condition: stable for discharge home  Patient presented with concern for an acute illness with associated systemic symptoms and significant discomfort requiring urgent management. In my opinion, this is a condition that a prudent lay person (someone who possesses an average knowledge of health and medicine) may potentially expect to result in complications if not addressed urgently such as respiratory distress, impairment of bodily function or dysfunction of bodily organs.   As such, the patient has been evaluated and assessed, work-up was performed and treatment was provided in alignment with urgent care protocols and evidence based medicine.  Patient/parent/caregiver has been advised that the patient may require follow up for further testing and/or treatment if the symptoms continue in spite of treatment, as clinically indicated and appropriate.  Routine symptom specific, illness specific and/or disease specific instructions were discussed with the patient and/or caregiver at length.  Prevention strategies for avoiding STD exposure  were also discussed.  The patient will follow up with their current PCP if and as advised. If the patient does not currently have a PCP we will assist them in obtaining one.   The patient may need specialty follow up if the symptoms continue, in spite of conservative treatment and management, for further workup, evaluation, consultation and treatment as clinically indicated and appropriate.  Patient/parent/caregiver verbalized understanding and agreement of plan as discussed.  All questions were addressed during visit.  Please see discharge instructions below for further details of plan.    Discharge Instructions      The results of your STD testing today which screens for gonorrhea, chlamydia, and trichomonas will be posted to your MyChart account once it is complete.  This  typically takes 2 to 4 days.  Please abstain from sexual intercourse of any kind, vaginal, oral or anal, until you have received the results of your STD testing.      The results of your HIV and syphilis blood tests will be made available to you once they are complete.  They will initially be posted to your MyChart account which typically takes 2 to 3 days.      If any of your results are abnormal, you will receive a phone call regarding treatment.  Prescriptions, if any are needed, will be provided for you at your pharmacy.      Thank you for visiting [facility] today.  We appreciate the opportunity to participate in your care.       This office note has been dictated using Teaching laboratory technician.  Unfortunately, this method of dictation can sometimes lead to typographical or grammatical errors.  I apologize for your inconvenience in advance if this occurs.  Please do not hesitate to reach out to me if clarification is needed.       Joesph Shaver Scales, PA-C 01/02/24 1756

## 2024-01-03 LAB — CYTOLOGY, (ORAL, ANAL, URETHRAL) ANCILLARY ONLY
Chlamydia: NEGATIVE
Comment: NEGATIVE
Comment: NEGATIVE
Comment: NORMAL
Neisseria Gonorrhea: NEGATIVE
Trichomonas: NEGATIVE

## 2024-01-03 LAB — RPR: RPR Ser Ql: NONREACTIVE

## 2024-01-03 LAB — HIV ANTIBODY (ROUTINE TESTING W REFLEX): HIV Screen 4th Generation wRfx: NONREACTIVE

## 2024-01-04 ENCOUNTER — Encounter: Payer: Self-pay | Admitting: Emergency Medicine

## 2024-01-05 ENCOUNTER — Ambulatory Visit
Admission: EM | Admit: 2024-01-05 | Discharge: 2024-01-05 | Disposition: A | Discharge status: Home / Self Care | Payer: 59 | Attending: Internal Medicine | Admitting: Internal Medicine

## 2024-01-05 ENCOUNTER — Other Ambulatory Visit: Payer: Self-pay

## 2024-01-05 ENCOUNTER — Ambulatory Visit: Payer: 59

## 2024-01-05 DIAGNOSIS — Z202 Contact with and (suspected) exposure to infections with a predominantly sexual mode of transmission: Secondary | ICD-10-CM | POA: Insufficient documentation

## 2024-01-05 MED ORDER — METRONIDAZOLE 500 MG PO TABS: 500.0000 mg | ORAL_TABLET | Freq: Two times a day (BID) | ORAL | 0 refills | Status: AC

## 2024-01-05 NOTE — ED Triage Notes (Signed)
Patient states he is concerned about STD exposure. Patients partner was positive for Tric   .

## 2024-01-05 NOTE — Discharge Instructions (Signed)
Your swab was sent to the lab for further testing.  You will be called with results. Flagyl is an antibiotic given to treat trichomonas. Take the prescription as directed.  You should avoid all sexual activity until you have been notified of all your results and have undergone any necessary treatment.  If you are positive, it is recommended that you inform all sexual partners so they can treat be treated as well before having sex again.

## 2024-01-05 NOTE — ED Provider Notes (Signed)
[facility]  Note:  This document was prepared using Dragon voice recognition software and may include unintentional dictation errors.  MRN: [id number] DOB: 1996/06/18 DATE: 01/05/24   Subjective:  Chief Complaint:  Chief Complaint  Patient presents with   Exposure to STD     HPI: Dylan Tapia is a 28 y.o. male presenting for STD testing. Patient was seen on 01/02/2024 for STD testing. He states cytology, RPR, and HIV were all negative. He states his partner had testing the same day and was positive for trichomoniasis. He is concerned because they had unprotected sex two days prior to testing. He is currently asymptomatic. He is also concerned because he believes that he may not have preformed the swab correctly. Denies fever, dysuria, penile discharge, penile lesions. Presents NAD.  Prior to Admission medications   Medication Sig Start Date End Date Taking? Authorizing Provider  metroNIDAZOLE (FLAGYL) 500 MG tablet Take 1 tablet (500 mg total) by mouth every 12 (twelve) hours for 7 days. 01/05/24 01/12/24 Yes Ane Conerly P, PA-C     No Known Allergies  History:   Past Medical History:  Diagnosis Date   Asthma      History reviewed. No pertinent surgical history.  History reviewed. No pertinent family history.  Social History   Tobacco Use   Smoking status: Every Day    Current packs/day: 0.00    Types: Cigarettes    Last attempt to quit: 01/13/2017    Years since quitting: 6.9   Smokeless tobacco: Never  Substance Use Topics   Alcohol use: No   Drug use: Yes    Types: Marijuana    Review of Systems  Constitutional:  Negative for fever.  Gastrointestinal:  Negative for abdominal pain, nausea and vomiting.  Genitourinary:  Negative for dysuria, genital sores and penile discharge.     Objective:   Vitals: BP (!) 146/83 (BP Location: Right Arm)   Pulse 68   Temp 98 F (36.7 C) (Oral)   Resp 16   Wt 185 lb (83.9 kg)   SpO2 97%   BMI 26.54 kg/m    Physical Exam Exam conducted with a chaperone present Evelina Dun, RT).  Constitutional:      General: He is not in acute distress.    Appearance: Normal appearance. He is well-developed and normal weight. He is not ill-appearing or toxic-appearing.  HENT:     Head: Normocephalic and atraumatic.  Cardiovascular:     Rate and Rhythm: Normal rate and regular rhythm.     Heart sounds: Normal heart sounds.  Pulmonary:     Effort: Pulmonary effort is normal.     Breath sounds: Normal breath sounds.     Comments: Clear to auscultation bilaterally  Abdominal:     General: Bowel sounds are normal.     Palpations: Abdomen is soft.     Tenderness: There is no abdominal tenderness.  Genitourinary:    Penis: Normal. No lesions.   Skin:    General: Skin is warm and dry.  Neurological:     General: No focal deficit present.     Mental Status: He is alert.  Psychiatric:        Mood and Affect: Mood and affect normal.     Results:  Labs: No results found for this or any previous visit (from the past 24 hours).  Radiology: No results found.   UC Course/Treatments:  Procedures: Procedures   Medications Ordered in UC: Medications - No data to display  Assessment and Plan :     ICD-10-CM   1. Exposure to trichomonas  Z20.2      Exposure to trichomonas Afebrile, nontoxic-appearing, NAD. VSS. Currently asymptomatic Patient requesting repeat testing.  Cytology is pending.  Suspect patient was testing too early.  Metronidazole 500 mg twice daily was prescribed given exposure to trichomoniasis. Strict ED precautions were given and patient verbalized understanding.   ED Discharge Orders          Ordered    metroNIDAZOLE (FLAGYL) 500 MG tablet  Every 12 hours        01/05/24 1002             PDMP not reviewed this encounter.     Clydie Dillen P, PA-C 01/05/24 1019

## 2024-01-06 LAB — CYTOLOGY, (ORAL, ANAL, URETHRAL) ANCILLARY ONLY
Chlamydia: NEGATIVE
Comment: NEGATIVE
Comment: NEGATIVE
Comment: NORMAL
Neisseria Gonorrhea: NEGATIVE
Trichomonas: NEGATIVE

## 2024-03-09 ENCOUNTER — Encounter (HOSPITAL_COMMUNITY): Payer: Self-pay

## 2024-03-09 ENCOUNTER — Ambulatory Visit (HOSPITAL_COMMUNITY)
Admission: EM | Admit: 2024-03-09 | Discharge: 2024-03-09 | Disposition: A | Discharge status: Home / Self Care | Attending: Family Medicine | Admitting: Family Medicine

## 2024-03-09 DIAGNOSIS — M62830 Muscle spasm of back: Secondary | ICD-10-CM

## 2024-03-09 MED ORDER — BACLOFEN 20 MG PO TABS: 20.0000 mg | ORAL_TABLET | Freq: Three times a day (TID) | ORAL | 0 refills | Status: DC

## 2024-03-09 MED ORDER — DICLOFENAC SODIUM 75 MG PO TBEC: 75.0000 mg | DELAYED_RELEASE_TABLET | Freq: Two times a day (BID) | ORAL | 0 refills | Status: DC

## 2024-03-09 NOTE — Discharge Instructions (Addendum)
 1. Muscle spasm of back (Primary) - baclofen (LIORESAL) 20 MG tablet; Take 1 tablet (20 mg total) by mouth 3 (three) times daily.  Dispense: 30 each; Refill: 0 - diclofenac (VOLTAREN) 75 MG EC tablet; Take 1 tablet (75 mg total) by mouth 2 (two) times daily.  Dispense: 30 tablet; Refill: 0 -Apply ice to the area of discomfort 2-3 times a day for 10 to 15 minutes at a time -Use foam roller or tennis ball over muscles to help massage muscles for relaxation -Continue to monitor symptoms for any change in severity if there is any increase in current severity or development of new symptoms follow-up for further evaluation management

## 2024-03-09 NOTE — ED Triage Notes (Signed)
 Patient presenting with back pain onset 3 weeks ago. 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   Prescriptions or OTC medications tried: Yes- tylenol, ibuprofen, Oxys    with no relief.

## 2024-03-09 NOTE — ED Provider Notes (Signed)
 [facility] [place]  Note:  This document was prepared using Dragon voice recognition software and may include unintentional dictation errors.  MRN: [id number] DOB: [birth year]  Subjective:   Dylan Tapia is a 28 y.o. male presenting for right sided mid to lower back pain without known injury.  Patient reports that he injured his back when he was 14 or 15 lifting weights and since that time has had intermittent muscle spasms and back pain.  Patient does not specifically remember any recent injury or trauma.  Patient states he works at [employer] and has to lift heavy things for work and occasionally will wake up the next morning with back pain and stiffness.  Patient reports that he is taken several over-the-counter medication as well as oxycodone that he had leftover from previous injury with minimal improvement to symptoms.  Patient denies any other medical concerns at this time.  No current facility-administered medications for this encounter.  Current Outpatient Medications:    baclofen (LIORESAL) 20 MG tablet, Take 1 tablet (20 mg total) by mouth 3 (three) times daily., Disp: 30 each, Rfl: 0   diclofenac (VOLTAREN) 75 MG EC tablet, Take 1 tablet (75 mg total) by mouth 2 (two) times daily., Disp: 30 tablet, Rfl: 0   No Known Allergies  Past Medical History:  Diagnosis Date   Asthma      History reviewed. No pertinent surgical history.  History reviewed. No pertinent family history.  Social History   Tobacco Use   Smoking status: Every Day    Current packs/day: 0.00    Types: Cigarettes    Last attempt to quit: 01/13/2017    Years since quitting: 7.1   Smokeless tobacco: Never  Substance Use Topics   Alcohol use: No   Drug use: Yes    Types: Marijuana    ROS Refer to HPI for ROS details.  Objective:   Vitals: BP 133/81 (BP Location: Left Arm)   Pulse 66   Temp 98.1 F (36.7 C) (Oral)   Resp 16   Ht 5\' 11"  (1.803 m)   Wt 184 lb 15.5 oz (83.9 kg)    SpO2 94%   BMI 25.80 kg/m   Physical Exam Vitals and nursing note reviewed.  Constitutional:      General: He is not in acute distress.    Appearance: Normal appearance. He is well-developed. He is not ill-appearing or toxic-appearing.  HENT:     Head: Normocephalic.  Cardiovascular:     Rate and Rhythm: Normal rate.  Pulmonary:     Effort: Pulmonary effort is normal. No respiratory distress.  Musculoskeletal:     Cervical back: Normal.     Thoracic back: Spasms and tenderness present. No swelling, edema, deformity or bony tenderness. Normal range of motion.     Lumbar back: Spasms and tenderness present. No swelling, edema, deformity or bony tenderness. Decreased range of motion.       Back:  Skin:    General: Skin is warm and dry.  Neurological:     General: No focal deficit present.     Mental Status: He is alert and oriented to person, place, and time.  Psychiatric:        Mood and Affect: Mood normal.     Procedures  No results found for this or any previous visit (from the past 24 hours).  Assessment and Plan :   PDMP not reviewed this encounter.  1. Muscle spasm of back    1. Muscle spasm  of back (Primary) - baclofen (LIORESAL) 20 MG tablet; Take 1 tablet (20 mg total) by mouth 3 (three) times daily.  Dispense: 30 each; Refill: 0 - diclofenac (VOLTAREN) 75 MG EC tablet; Take 1 tablet (75 mg total) by mouth 2 (two) times daily.  Dispense: 30 tablet; Refill: 0 -Apply ice to the area of discomfort 2-3 times a day for 10 to 15 minutes at a time -Use foam roller or tennis ball over muscles to help massage muscles for relaxation -Continue to monitor symptoms for any change in severity if there is any increase in current severity or development of new symptoms follow-up for further evaluation management  Tonny Bollman, Edison Nicholson B, NP 03/09/24 0930

## 2024-03-30 ENCOUNTER — Emergency Department (HOSPITAL_COMMUNITY)

## 2024-03-30 ENCOUNTER — Emergency Department (HOSPITAL_COMMUNITY)
Admission: EM | Admit: 2024-03-30 | Discharge: 2024-03-30 | Disposition: A | Discharge status: Home / Self Care | Attending: Emergency Medicine | Admitting: Emergency Medicine

## 2024-03-30 ENCOUNTER — Other Ambulatory Visit: Payer: Self-pay

## 2024-03-30 DIAGNOSIS — S60221A Contusion of right hand, initial encounter: Secondary | ICD-10-CM | POA: Insufficient documentation

## 2024-03-30 DIAGNOSIS — W228XXA Striking against or struck by other objects, initial encounter: Secondary | ICD-10-CM | POA: Insufficient documentation

## 2024-03-30 DIAGNOSIS — S6991XA Unspecified injury of right wrist, hand and finger(s), initial encounter: Secondary | ICD-10-CM | POA: Diagnosis present

## 2024-03-30 MED ORDER — HYDROCODONE-ACETAMINOPHEN 5-325 MG PO TABS
1.0000 | ORAL_TABLET | Freq: Once | ORAL | Status: AC
  Administered 2024-03-30: 1 via ORAL
  Filled 2024-03-30: qty 1

## 2024-03-30 NOTE — Progress Notes (Signed)
 Orthopedic Tech Progress Note Patient Details:  Dylan Tapia January 16, 1996 [id number]  Ortho Devices Type of Ortho Device: Wrist splint Ortho Device/Splint Location: RUE Ortho Device/Splint Interventions: Ordered, Application, Adjustment   Post Interventions Patient Tolerated: Well Instructions Provided: Adjustment of device, Care of device  Tonye Pearson 03/30/2024, 10:40 AM

## 2024-03-30 NOTE — ED Triage Notes (Signed)
 Pt. Stated, I think I have a boxer fracture , I hit I think something. Right hand swollen.

## 2024-03-30 NOTE — Discharge Instructions (Addendum)
 Please wear wrist brace during the day, like at work. You do not need to sleep with the brace on. Your x-ray shows no fracture.  Take 1000mg  of Tylenol every 6 hours and an anti-inflammatory. Diclofenac is an anti-inflammatory.  You can take the diclofenac as discussed, however, do not mix with ibuprofen naproxen or Motrin. I don't think Baclofen will be beneficial because this is a muscle relaxer. You can use ice as well.  Follow up with PCP to make sure symptoms are improving.

## 2024-03-30 NOTE — ED Provider Notes (Signed)
 [place] EMERGENCY DEPARTMENT AT [facility] Provider Note   CSN: [id number] Arrival date & time: 03/30/24  7829     History  Chief Complaint  Patient presents with   Hand Pain    Dylan Tapia is a 28 y.o. male.  Without significant past medical history is reporting to emergency room with right hand pain.  Patient reports that last night he punched a box and started experiencing right hand pain located around the 4th and 5th mid metatarsal.  He noticed some swelling and difficulty with full range of motion.  He has history of injury on this hand.  He has not tried anything for symptoms.  He has no open laceration over the wound.  He sustained no other injuries.   Hand Pain       Home Medications Prior to Admission medications   Medication Sig Start Date End Date Taking? Authorizing Provider  baclofen (LIORESAL) 20 MG tablet Take 1 tablet (20 mg total) by mouth 3 (three) times daily. 03/09/24   Reddick, Nicola Girt B, NP  diclofenac (VOLTAREN) 75 MG EC tablet Take 1 tablet (75 mg total) by mouth 2 (two) times daily. 03/09/24   Lucky Cowboy, NP      Allergies    Patient has no known allergies.    Review of Systems   Review of Systems  Musculoskeletal:  Positive for arthralgias.    Physical Exam Updated Vital Signs BP (!) 145/87 (BP Location: Right Arm)   Pulse 68   Temp 98.2 F (36.8 C)   Resp 18   SpO2 99%  Physical Exam Vitals and nursing note reviewed.  Constitutional:      General: He is not in acute distress.    Appearance: He is not toxic-appearing.  HENT:     Head: Normocephalic and atraumatic.  Eyes:     General: No scleral icterus.    Conjunctiva/sclera: Conjunctivae normal.  Cardiovascular:     Rate and Rhythm: Normal rate and regular rhythm.     Pulses: Normal pulses.     Heart sounds: Normal heart sounds.  Pulmonary:     Effort: Pulmonary effort is normal. No respiratory distress.     Breath sounds: Normal breath sounds.   Abdominal:     General: Abdomen is flat. Bowel sounds are normal.     Palpations: Abdomen is soft.     Tenderness: There is no abdominal tenderness.  Musculoskeletal:     Comments: No tenderness to palpation over anatomical snuffbox.  Full range of motion of wrist.  He does have tenderness over the full length of the 4th and 5th metatarsal.  No tenderness over digits.  He has strong radial pulse.  He is neurovascularly intact.  Skin:    General: Skin is warm and dry.     Findings: No lesion.  Neurological:     General: No focal deficit present.     Mental Status: He is alert and oriented to person, place, and time. Mental status is at baseline.     ED Results / Procedures / Treatments   Labs (all labs ordered are listed, but only abnormal results are displayed) Labs Reviewed - No data to display  EKG None  Radiology DG Hand Complete Right Result Date: 03/30/2024 CLINICAL DATA:  Right hand pain and swelling near the wrist after hitting an object with his right hand. EXAM: RIGHT HAND - COMPLETE 3+ VIEW COMPARISON:  01/20/2017 FINDINGS: There is no evidence of fracture or dislocation. There  is no evidence of arthropathy or other focal bone abnormality. Soft tissues are unremarkable. IMPRESSION: Negative. Electronically Signed   By: Beckie Salts M.D.   On: 03/30/2024 10:00    Procedures Procedures    Medications Ordered in ED Medications - No data to display  ED Course/ Medical Decision Making/ A&P                                 Medical Decision Making Amount and/or Complexity of Data Reviewed Radiology: ordered.  Risk Prescription drug management.   This patient presents to the ED for concern of right hand pain, this involves an extensive number of treatment options, and is a complaint that carries with it a high risk of complications and morbidity.  The differential diagnosis includes scaphoid fracture,     Imaging Studies ordered:  I ordered imaging studies  including right hand x-ray  I independently visualized and interpreted imaging which showed negative  I agree with the radiologist interpretation   Cardiac Monitoring: / EKG:  The patient was maintained on a cardiac monitor.      Problem List / ED Course / Critical interventions / Medication management  Reporting to emergency room with complaint of right hand pain which has been ongoing for 12 hours after he punched a board last night.  He reports he woke up this morning and he noticed that his hand was swollen and painful also he was concerned that he had had boxer's fracture.  He has had history of similar in the past.  He is neurovascularly intact and has no area of focal tenderness.  He has no obvious ecchymosis or deformity.  Does have swelling over the dorsal aspect of his hand.  He has good grip strength however reports discomfort when squeezing hand.  Given that x-ray is negative for acute fracture and this is posttrauma feel no further workup is necessary in the ED at this time. No overlaying cellulitis or concern for septic joint. He was given return precautions.  Will place in wrist brace.  Given symptom control. I ordered medication including Norco for pain Reevaluation of the patient after these medicines showed that the patient improved I have reviewed the patients home medicines and have made adjustments as needed   Plan  F/u w/ PCP in 2-3d to ensure resolution of sx.  Patient was given return precautions. Patient stable for discharge at this time.  Patient educated on sx/dx and verbalized understanding of plan. Return to ER w/ new or worsening sx.          Final Clinical Impression(s) / ED Diagnoses Final diagnoses:  Contusion of right hand, initial encounter    Rx / DC Orders ED Discharge Orders     None         Reinaldo Raddle 03/30/24 1058    Rondel Baton, MD 03/30/24 1732

## 2024-10-31 ENCOUNTER — Encounter (HOSPITAL_COMMUNITY): Payer: Self-pay

## 2024-10-31 ENCOUNTER — Ambulatory Visit (HOSPITAL_COMMUNITY)
Admission: RE | Admit: 2024-10-31 | Discharge: 2024-10-31 | Disposition: A | Discharge status: Home / Self Care | Source: Ambulatory Visit | Attending: Emergency Medicine | Admitting: Emergency Medicine

## 2024-10-31 VITALS — BP 117/66 | HR 70 | Temp 97.9°F | Resp 15

## 2024-10-31 DIAGNOSIS — Z202 Contact with and (suspected) exposure to infections with a predominantly sexual mode of transmission: Secondary | ICD-10-CM | POA: Insufficient documentation

## 2024-10-31 LAB — HIV ANTIBODY (ROUTINE TESTING W REFLEX): HIV Screen 4th Generation wRfx: NONREACTIVE

## 2024-10-31 MED ORDER — DOXYCYCLINE HYCLATE 100 MG PO TABS: 100.0000 mg | ORAL_TABLET | Freq: Two times a day (BID) | ORAL | 0 refills | Status: AC

## 2024-10-31 MED ORDER — METRONIDAZOLE 500 MG PO TABS: 500.0000 mg | ORAL_TABLET | Freq: Two times a day (BID) | ORAL | 0 refills | Status: DC

## 2024-10-31 NOTE — ED Provider Notes (Signed)
 [facility]    CSN: [id number] Arrival date & time: 10/31/24  9081      History   Chief Complaint Chief Complaint  Patient presents with   appt 930    HPI Maan MCKENNON ZWART is a 28 y.o. male.   Patient presents to clinic over concern of a unique feeling when urinating as well as exposure to chlamydia and trichomoniasis.  He has been sexually active without protection with a male who recently notified him that she tested positive for chlamydia and trichomonas.  He did notice after he urinated when he had talked on his penis that he had some white discharge as well.  Would like HIV and syphilis screening.  The history is provided by the patient and medical records.    Past Medical History:  Diagnosis Date   Asthma     Patient Active Problem List   Diagnosis Date Noted   Screening examination for STD (sexually transmitted disease) 01/02/2024    History reviewed. No pertinent surgical history.     Home Medications    Prior to Admission medications   Medication Sig Start Date End Date Taking? Authorizing Provider  doxycycline  (VIBRA -TABS) 100 MG tablet Take 1 tablet (100 mg total) by mouth 2 (two) times daily for 7 days. 10/31/24 11/07/24 Yes Conlin Brahm  N, FNP  metroNIDAZOLE  (FLAGYL ) 500 MG tablet Take 1 tablet (500 mg total) by mouth 2 (two) times daily. 10/31/24  Yes Dreama, Amellia Panik  N, FNP    Family History No family history on file.  Social History Social History   Tobacco Use   Smoking status: Every Day    Current packs/day: 0.00    Types: Cigarettes    Last attempt to quit: 01/13/2017    Years since quitting: 7.8   Smokeless tobacco: Never  Substance Use Topics   Alcohol use: No   Drug use: Yes    Types: Marijuana     Allergies   Patient has no known allergies.   Review of Systems Review of Systems  Per HPI  Physical Exam Triage Vital Signs ED Triage Vitals  Encounter Vitals Group     BP 10/31/24 0943 117/66      Girls Systolic BP Percentile --      Girls Diastolic BP Percentile --      Boys Systolic BP Percentile --      Boys Diastolic BP Percentile --      Pulse Rate 10/31/24 0943 70     Resp 10/31/24 0943 15     Temp 10/31/24 0943 97.9 F (36.6 C)     Temp Source 10/31/24 0943 Oral     SpO2 10/31/24 0943 99 %     Weight --      Height --      Head Circumference --      Peak Flow --      Pain Score 10/31/24 0942 0     Pain Loc --      Pain Education --      Exclude from Growth Chart --    No data found.  Updated Vital Signs BP 117/66 (BP Location: Left Arm)   Pulse 70   Temp 97.9 F (36.6 C) (Oral)   Resp 15   SpO2 99%   Visual Acuity Right Eye Distance:   Left Eye Distance:   Bilateral Distance:    Right Eye Near:   Left Eye Near:    Bilateral Near:     Physical Exam Vitals and nursing  note reviewed.  Constitutional:      Appearance: Normal appearance.  HENT:     Head: Normocephalic and atraumatic.     Right Ear: External ear normal.     Left Ear: External ear normal.     Nose: Nose normal.     Mouth/Throat:     Mouth: Mucous membranes are moist.  Cardiovascular:     Rate and Rhythm: Normal rate.  Pulmonary:     Effort: Pulmonary effort is normal. No respiratory distress.  Neurological:     General: No focal deficit present.     Mental Status: He is alert and oriented to person, place, and time.  Psychiatric:        Mood and Affect: Mood normal.        Behavior: Behavior normal.      UC Treatments / Results  Labs (all labs ordered are listed, but only abnormal results are displayed) Labs Reviewed  RPR  HIV ANTIBODY (ROUTINE TESTING W REFLEX)  CYTOLOGY, (ORAL, ANAL, URETHRAL) ANCILLARY ONLY    EKG   Radiology No results found.  Procedures Procedures (including critical care time)  Medications Ordered in UC Medications - No data to display  Initial Impression / Assessment and Plan / UC Course  I have reviewed the triage vital signs and the  nursing notes.  Pertinent labs & imaging results that were available during my care of the patient were reviewed by me and considered in my medical decision making (see chart for details).  Vitals and triage reviewed, patient is hemodynamically stable.  Penile discharge with recent exposure to chlamydia and trichomonas.  Will treat empirically for both.  Cytology swab obtained as well as HIV and syphilis screening.  Plan of care, follow-up care return precautions given, no questions at this time.     Final Clinical Impressions(s) / UC Diagnoses   Final diagnoses:  Potential exposure to STD     Discharge Instructions      Today have been screened for sexually transmitted infections and we are empirically treating you for chlamydia and trichomoniasis.  Take the antibiotics twice daily with food to help prevent stomach upset.  Abstain from intercourse until results have been received.  Staff will contact you if further treatment is needed or modification is needed based on results.    ED Prescriptions     Medication Sig Dispense Auth. Provider   metroNIDAZOLE  (FLAGYL ) 500 MG tablet Take 1 tablet (500 mg total) by mouth 2 (two) times daily. 14 tablet Dreama, Makenze Ellett  N, FNP   doxycycline  (VIBRA -TABS) 100 MG tablet Take 1 tablet (100 mg total) by mouth 2 (two) times daily for 7 days. 14 tablet Dreama, Dakia Schifano  N, FNP      PDMP not reviewed this encounter.   Dreama, Chino Sardo  N, FNP 10/31/24 1027

## 2024-10-31 NOTE — ED Triage Notes (Signed)
 Pt reports 3-4 days having funny feeling when urinate and when felt funny tugged on it a little bit little white stuff came out. Pt reports possible exposure to Trich and chlamydia.

## 2024-10-31 NOTE — Discharge Instructions (Addendum)
 Today have been screened for sexually transmitted infections and we are empirically treating you for chlamydia and trichomoniasis.  Take the antibiotics twice daily with food to help prevent stomach upset.  Abstain from intercourse until results have been received.  Staff will contact you if further treatment is needed or modification is needed based on results.

## 2024-11-01 ENCOUNTER — Ambulatory Visit (HOSPITAL_COMMUNITY): Payer: Self-pay

## 2024-11-01 LAB — CYTOLOGY, (ORAL, ANAL, URETHRAL) ANCILLARY ONLY
Chlamydia: POSITIVE — AB
Comment: NEGATIVE
Comment: NEGATIVE
Comment: NORMAL
Neisseria Gonorrhea: NEGATIVE
Trichomonas: NEGATIVE

## 2024-11-01 LAB — RPR: RPR Ser Ql: NONREACTIVE

## 2024-12-07 ENCOUNTER — Ambulatory Visit (HOSPITAL_COMMUNITY)
Admission: EM | Admit: 2024-12-07 | Discharge: 2024-12-07 | Disposition: A | Discharge status: Home / Self Care | Attending: Emergency Medicine | Admitting: Emergency Medicine

## 2024-12-07 ENCOUNTER — Encounter (HOSPITAL_COMMUNITY): Payer: Self-pay

## 2024-12-07 DIAGNOSIS — B9789 Other viral agents as the cause of diseases classified elsewhere: Secondary | ICD-10-CM | POA: Diagnosis not present

## 2024-12-07 DIAGNOSIS — J019 Acute sinusitis, unspecified: Secondary | ICD-10-CM

## 2024-12-07 MED ORDER — AZELASTINE HCL 0.1 % NA SOLN: 2.0000 | Freq: Two times a day (BID) | NASAL | 0 refills | Status: AC

## 2024-12-07 MED ORDER — PREDNISONE 20 MG PO TABS: 40.0000 mg | ORAL_TABLET | Freq: Every day | ORAL | 0 refills | Status: AC

## 2024-12-07 NOTE — ED Triage Notes (Signed)
 Pt present with c/o pain to the lt side of the face, cough, sneezing and runny nose X 1 week. Pt reports intermittent fever. Last night he had a fever.  Home interventions: Tylenol , Daqyuil and tea

## 2024-12-07 NOTE — Discharge Instructions (Addendum)
 I discussed I believe her symptoms are likely related to a viral sinusitis.  This does not require antibiotics at this time. Start taking 2 tablets of prednisone once daily to help with sinus pressure. Use azelastine nasal spray twice daily for additional relief of nasal congestion and sinus pressure. You can also take over-the-counter Mucinex for cough and congestion as well. Alternate between Tylenol  and ibuprofen  every 6-8 hours as needed for pain. Follow-up with your primary care provider or return here as needed.

## 2024-12-07 NOTE — ED Provider Notes (Signed)
 " [facility]    CSN: [id number] Arrival date & time: 12/07/24  9160      History   Chief Complaint Chief Complaint  Patient presents with   Headache   Cough   Fever    HPI Dylan Tapia is a 28 y.o. male.   Patient presents with left-sided facial pain, nasal congestion, cough, and sneezing x 5 days.  Patient denies any known fever, but states that he did have some mild chills last night and thought that he might have felt feverish.  Patient reports that he has been taking DayQuil with minimal relief.  Patient states that he did take Tylenol  today with some relief of his pain.  Denies chest pain, shortness of breath, nausea, vomiting, diarrhea, and abdominal pain.  The history is provided by the patient and medical records.  Headache Associated symptoms: cough and fever   Cough Associated symptoms: fever and headaches   Fever Associated symptoms: cough and headaches     Past Medical History:  Diagnosis Date   Asthma     Patient Active Problem List   Diagnosis Date Noted   Screening examination for STD (sexually transmitted disease) 01/02/2024    History reviewed. No pertinent surgical history.     Home Medications    Prior to Admission medications  Medication Sig Start Date End Date Taking? Authorizing Provider  azelastine (ASTELIN) 0.1 % nasal spray Place 2 sprays into both nostrils 2 (two) times daily. Use in each nostril as directed 12/07/24  Yes Johnie Flaming A, NP  predniSONE (DELTASONE) 20 MG tablet Take 2 tablets (40 mg total) by mouth daily for 3 days. 12/07/24 12/10/24 Yes Johnie Flaming LABOR, NP    Family History History reviewed. No pertinent family history.  Social History Social History[1]   Allergies   Patient has no known allergies.   Review of Systems Review of Systems  Constitutional:  Positive for fever.  Respiratory:  Positive for cough.   Neurological:  Positive for headaches.   Per HPI  Physical Exam Triage  Vital Signs ED Triage Vitals  Encounter Vitals Group     BP 12/07/24 0858 (!) 136/93     Girls Systolic BP Percentile --      Girls Diastolic BP Percentile --      Boys Systolic BP Percentile --      Boys Diastolic BP Percentile --      Pulse Rate 12/07/24 0856 76     Resp 12/07/24 0856 18     Temp 12/07/24 0858 98.6 F (37 C)     Temp src --      SpO2 12/07/24 0856 98 %     Weight --      Height --      Head Circumference --      Peak Flow --      Pain Score 12/07/24 0855 0     Pain Loc --      Pain Education --      Exclude from Growth Chart --    No data found.  Updated Vital Signs BP (!) 136/93   Pulse 76   Temp 98.6 F (37 C)   Resp 18   SpO2 98%   Visual Acuity Right Eye Distance:   Left Eye Distance:   Bilateral Distance:    Right Eye Near:   Left Eye Near:    Bilateral Near:     Physical Exam Vitals and nursing note reviewed.  Constitutional:  General: He is awake. He is not in acute distress.    Appearance: Normal appearance. He is well-developed and well-groomed. He is not ill-appearing.  HENT:     Right Ear: Tympanic membrane, ear canal and external ear normal.     Left Ear: Tympanic membrane, ear canal and external ear normal.     Nose: Congestion and rhinorrhea present.     Left Sinus: Maxillary sinus tenderness present.     Mouth/Throat:     Mouth: Mucous membranes are moist.     Pharynx: Posterior oropharyngeal erythema and postnasal drip present. No oropharyngeal exudate.  Cardiovascular:     Rate and Rhythm: Normal rate and regular rhythm.  Pulmonary:     Effort: Pulmonary effort is normal.     Breath sounds: Normal breath sounds.  Skin:    General: Skin is warm and dry.  Neurological:     General: No focal deficit present.     Mental Status: He is alert and oriented to person, place, and time. Mental status is at baseline.  Psychiatric:        Behavior: Behavior is cooperative.      UC Treatments / Results  Labs (all labs  ordered are listed, but only abnormal results are displayed) Labs Reviewed - No data to display  EKG   Radiology No results found.  Procedures Procedures (including critical care time)  Medications Ordered in UC Medications - No data to display  Initial Impression / Assessment and Plan / UC Course  I have reviewed the triage vital signs and the nursing notes.  Pertinent labs & imaging results that were available during my care of the patient were reviewed by me and considered in my medical decision making (see chart for details).     Patient is overall well-appearing.  Vitals are stable.  Exam findings likely related to viral sinusitis.  Prescribed a short course of prednisone to assist with facial pain and pressure.  Prescribed azelastine nasal spray to help with nasal congestion and sinus pressure.  Recommended over-the-counter medications for symptoms as well.  Discussed follow-up and return precautions. Final Clinical Impressions(s) / UC Diagnoses   Final diagnoses:  Acute viral sinusitis     Discharge Instructions      I discussed I believe her symptoms are likely related to a viral sinusitis.  This does not require antibiotics at this time. Start taking 2 tablets of prednisone once daily to help with sinus pressure. Use azelastine nasal spray twice daily for additional relief of nasal congestion and sinus pressure. You can also take over-the-counter Mucinex for cough and congestion as well. Alternate between Tylenol  and ibuprofen  every 6-8 hours as needed for pain. Follow-up with your primary care provider or return here as needed.     ED Prescriptions     Medication Sig Dispense Auth. Provider   predniSONE (DELTASONE) 20 MG tablet Take 2 tablets (40 mg total) by mouth daily for 3 days. 6 tablet Johnie Flaming A, NP   azelastine (ASTELIN) 0.1 % nasal spray Place 2 sprays into both nostrils 2 (two) times daily. Use in each nostril as directed 30 mL Johnie Flaming A, NP      PDMP not reviewed this encounter.    [1]  Social History Tobacco Use   Smoking status: Every Day    Current packs/day: 0.00    Types: Cigarettes    Last attempt to quit: 01/13/2017    Years since quitting: 7.9   Smokeless tobacco: Never  Substance Use Topics   Alcohol use: No   Drug use: Yes    Types: Marijuana     Johnie Rumaldo LABOR, NP 12/07/24 [id number]  "
